# Patient Record
Sex: Female | Born: 1970 | Race: White | Hispanic: No | Marital: Married | Smoking: Never smoker
Health system: Southern US, Community
[De-identification: ages and names within clinical notes are randomized; demographics above are authoritative.]

## PROBLEM LIST (undated history)

## (undated) DIAGNOSIS — M26609 Unspecified temporomandibular joint disorder, unspecified side: Secondary | ICD-10-CM

## (undated) DIAGNOSIS — Z860100 Personal history of colon polyps, unspecified: Secondary | ICD-10-CM

## (undated) DIAGNOSIS — N200 Calculus of kidney: Secondary | ICD-10-CM

## (undated) DIAGNOSIS — Q6589 Other specified congenital deformities of hip: Secondary | ICD-10-CM

## (undated) HISTORY — DX: Other specified congenital deformities of hip: Q65.89

## (undated) HISTORY — DX: Personal history of colon polyps, unspecified: Z86.0100

## (undated) HISTORY — PX: TUBAL LIGATION: SHX77

## (undated) HISTORY — PX: NECK SURGERY: SHX720

## (undated) HISTORY — DX: Unspecified temporomandibular joint disorder, unspecified side: M26.609

## (undated) HISTORY — PX: ABDOMINAL HYSTERECTOMY: SHX81

---
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
## 1997-06-21 ENCOUNTER — Ambulatory Visit (HOSPITAL_COMMUNITY): Admission: RE | Admit: 1997-06-21 | Discharge: 1997-06-21 | Payer: Self-pay | Admitting: *Deleted

## 1997-07-13 ENCOUNTER — Encounter: Admission: RE | Admit: 1997-07-13 | Discharge: 1997-07-13 | Payer: Self-pay | Admitting: Family Medicine

## 1997-07-20 ENCOUNTER — Encounter: Admission: RE | Admit: 1997-07-20 | Discharge: 1997-07-20 | Payer: Self-pay | Admitting: Family Medicine

## 1997-07-25 ENCOUNTER — Ambulatory Visit (HOSPITAL_COMMUNITY): Admission: RE | Admit: 1997-07-25 | Discharge: 1997-07-25 | Payer: Self-pay | Admitting: *Deleted

## 1997-07-27 ENCOUNTER — Encounter: Admission: RE | Admit: 1997-07-27 | Discharge: 1997-07-27 | Payer: Self-pay | Admitting: Family Medicine

## 1997-08-03 ENCOUNTER — Encounter: Admission: RE | Admit: 1997-08-03 | Discharge: 1997-08-03 | Payer: Self-pay | Admitting: Family Medicine

## 1997-08-08 ENCOUNTER — Encounter: Admission: RE | Admit: 1997-08-08 | Discharge: 1997-08-08 | Payer: Self-pay | Admitting: Family Medicine

## 1997-08-10 ENCOUNTER — Inpatient Hospital Stay (HOSPITAL_COMMUNITY): Admission: AD | Admit: 1997-08-10 | Discharge: 1997-08-10 | Payer: Self-pay | Admitting: Obstetrics

## 1997-08-12 ENCOUNTER — Inpatient Hospital Stay (HOSPITAL_COMMUNITY): Admission: AD | Admit: 1997-08-12 | Discharge: 1997-08-15 | Payer: Self-pay | Admitting: Obstetrics

## 1997-09-07 ENCOUNTER — Encounter: Admission: RE | Admit: 1997-09-07 | Discharge: 1997-09-07 | Payer: Self-pay | Admitting: Family Medicine

## 1997-09-20 ENCOUNTER — Encounter: Admission: RE | Admit: 1997-09-20 | Discharge: 1997-09-20 | Payer: Self-pay | Admitting: Sports Medicine

## 2004-01-22 ENCOUNTER — Emergency Department (HOSPITAL_COMMUNITY): Admission: EM | Admit: 2004-01-22 | Discharge: 2004-01-22 | Payer: Self-pay | Admitting: Emergency Medicine

## 2004-03-05 ENCOUNTER — Ambulatory Visit: Payer: Self-pay | Admitting: Internal Medicine

## 2004-04-20 ENCOUNTER — Ambulatory Visit: Payer: Self-pay | Admitting: Internal Medicine

## 2004-11-10 ENCOUNTER — Ambulatory Visit: Payer: Self-pay | Admitting: Family Medicine

## 2004-11-27 ENCOUNTER — Other Ambulatory Visit: Admission: RE | Admit: 2004-11-27 | Discharge: 2004-11-27 | Payer: Self-pay | Admitting: Obstetrics & Gynecology

## 2004-12-07 ENCOUNTER — Encounter: Payer: Self-pay | Admitting: Family Medicine

## 2004-12-07 LAB — CONVERTED CEMR LAB

## 2005-01-30 ENCOUNTER — Ambulatory Visit (HOSPITAL_COMMUNITY): Admission: RE | Admit: 2005-01-30 | Discharge: 2005-01-30 | Payer: Self-pay | Admitting: Obstetrics & Gynecology

## 2005-01-30 ENCOUNTER — Encounter (INDEPENDENT_AMBULATORY_CARE_PROVIDER_SITE_OTHER): Payer: Self-pay | Admitting: Specialist

## 2005-04-08 HISTORY — PX: PARTIAL HYSTERECTOMY: SHX80

## 2005-05-08 ENCOUNTER — Observation Stay (HOSPITAL_COMMUNITY): Admission: RE | Admit: 2005-05-08 | Discharge: 2005-05-09 | Payer: Self-pay | Admitting: Obstetrics & Gynecology

## 2005-05-08 ENCOUNTER — Encounter (INDEPENDENT_AMBULATORY_CARE_PROVIDER_SITE_OTHER): Payer: Self-pay | Admitting: Specialist

## 2005-11-06 ENCOUNTER — Ambulatory Visit: Payer: Self-pay | Admitting: Internal Medicine

## 2005-12-11 ENCOUNTER — Ambulatory Visit: Payer: Self-pay | Admitting: Family Medicine

## 2006-06-14 ENCOUNTER — Ambulatory Visit: Payer: Self-pay | Admitting: Family Medicine

## 2006-06-14 ENCOUNTER — Encounter: Payer: Self-pay | Admitting: Family Medicine

## 2006-06-14 LAB — CONVERTED CEMR LAB
Inflenza A Ag: NEGATIVE
Influenza B Ag: NEGATIVE

## 2007-01-13 ENCOUNTER — Ambulatory Visit: Payer: Self-pay | Admitting: Family Medicine

## 2007-01-13 DIAGNOSIS — G43009 Migraine without aura, not intractable, without status migrainosus: Secondary | ICD-10-CM | POA: Insufficient documentation

## 2007-01-28 ENCOUNTER — Ambulatory Visit: Payer: Self-pay | Admitting: Family Medicine

## 2007-01-28 DIAGNOSIS — M26609 Unspecified temporomandibular joint disorder, unspecified side: Secondary | ICD-10-CM | POA: Insufficient documentation

## 2007-03-16 ENCOUNTER — Ambulatory Visit: Payer: Self-pay | Admitting: Family Medicine

## 2007-04-17 ENCOUNTER — Ambulatory Visit: Payer: Self-pay | Admitting: Family Medicine

## 2007-04-21 ENCOUNTER — Encounter: Payer: Self-pay | Admitting: Family Medicine

## 2007-12-21 ENCOUNTER — Ambulatory Visit: Payer: Self-pay | Admitting: Family Medicine

## 2010-05-10 NOTE — Assessment & Plan Note (Signed)
Summary: DISCUSS HEADACHES/CLE   Vital Signs:  Patient Profile:   40 Years Old Female Weight:      120 pounds Temp:     97.5 degrees F oral Pulse rate:   68 / minute Pulse rhythm:   regular BP sitting:   84 / 60  (left arm) Cuff size:   regular  Vitals Entered By: Liane Comber (December 21, 2007 12:31 PM)                 Chief Complaint:  discuss headaches.  History of Present Illness: wants to be referred to different neurologist  saw Dr Ermalene Searing for last HA visit   went to headache clinic -- keeps goint up on topamax doing biofeedback and trigger injections  has had several visits-- stress is contributing to the migraines  is having daily headache at this point   ha tend to be over whole head-- some worsening on exertion was imp for short time-- now worse again    topamax makes her tingle all over and she has lost almost 40 lb-- not eating  some difficulty concentrating also  does not feel depressed or anxious   migraines got worse after hysterectomy   is doing biofeedback q 3 weeks --- "is failing the test"  flexeril - helps a little- but it knocks her out -- even if she takes 1/2 of a pill still has daily headache either way   no particular stress right now-- nl every day stuf   is wondering about needing CT and MRI   head hurts all the time  sometimes is so bad some days that is actually hurts to wash her hair -- scalp is actually very tender      Current Allergies (reviewed today): MORPHINE SULFATE (MORPHINE SULFATE)  Past Medical History:    migraine- chronic daily headache                neurol-- [facility]   Past Surgical History:    Reviewed history from 01/28/2007 and no changes required:                    BTL       1/07      Hyst. endometriosis / ovarian cyst  (1/2 ovary on left)       40 yo    Knee surgery (torn cartilage)   Family History:    Reviewed history from 01/28/2007 and no changes required:  Father: Color blind, DM II       Mother: HTN       Siblings:        DM:  GM       Ovarian CA:  PGM, died       Pancreatitis:  PGF, died       Cataracts:  PGM (both), Aunt, son as a child       HTN:  Sister       Skin CA:  in Grandparent (melanoma)       uncle with migraines                                                               Social History:    Reviewed history from 01/28/2007 and no changes required:  Never Smoked       Alcohol use-no       Drug use-no       Regular exercise-yes, a lot of walking to deliver mail, 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       Marital Status: Married, husband is a deacon x 9 years       Children: 63, 58, and 59 years old, 2 from previous marriage (previous spouse ETOH)       Occupation: [employer]    Review of Systems  General      Complains of loss of appetite and weight loss.      Denies fatigue, fever, and malaise.  Eyes      Denies blurring and eye pain.  CV      Denies chest pain or discomfort, palpitations, and shortness of breath with exertion.  Resp      Denies cough and wheezing.  GI      Complains of nausea.      Denies change in bowel habits and vomiting.  MS      Denies joint pain, joint redness, and joint swelling.  Derm      Denies itching, lesion(s), and rash.  Neuro      Complains of headaches and tingling.      Denies numbness and tremors.  Psych      Denies anxiety and depression.      does get frustrated by headaches   Endo      Complains of cold intolerance.      Denies excessive thirst, excessive urination, and heat intolerance.   Physical Exam  General:     Well-developed,well-nourished,in no acute distress; alert,appropriate and cooperative throughout examination-slim  Head:     normocephalic, atraumatic, and no abnormalities observed.   Eyes:     vision grossly intact, pupils equal, pupils round, and pupils reactive to light.  no conjunctival pallor, injection or icterus  Ears:     R ear normal and L  ear normal.   Nose:     no nasal discharge.   Mouth:     pharynx pink and moist.   Neck:     supple with full rom and no masses or thyromegally, no JVD or carotid bruit  Chest Wall:     No deformities, masses, or tenderness noted. Lungs:     Normal respiratory effort, chest expands symmetrically. Lungs are clear to auscultation, no crackles or wheezes. Heart:     Normal rate and regular rhythm. S1 and S2 normal without gallop, murmur, click, rub or other extra sounds. Msk:     No deformity or scoliosis noted of thoracic or lumbar spine.   Extremities:     No clubbing, cyanosis, edema, or deformity noted with normal full range of motion of all joints.   Neurologic:     cranial nerves II-XII intact, sensation intact to light touch, gait normal, DTRs symmetrical and normal, and Romberg negative.  no tremor  Skin:     Intact without suspicious lesions or rashes Cervical Nodes:     No lymphadenopathy noted Psych:     frustrated and tearful at times     Impression & Recommendations:  Problem # 1:  COMMON MIGRAINE (ICD-346.10) Assessment: Deteriorated chonic daily headache with likely multiple factors including muscle tension/ hormonal change -- and family hx  limited imp on topamax -- and now worsening  also wt loss and other side eff  pt is interested in returning to headache clinic to discuss other pot pharmacologic ts  Her updated medication list for this problem includes:    Ketoprofen 75 Mg Caps (Ketoprofen) ..... One by mouth as needed headache  Orders: Headache Clinic Referral (Headache)   Complete Medication List: 1)  Topamax 200 Mg Tabs (Topiramate) .... One by mouth daily along with 100mg  tab for a total of 300mg  2)  Topamax 100 Mg Tabs (Topiramate) .... One by mouth daily along with 200 mg tab for a total of 300mg  3)  Ketoprofen 75 Mg Caps (Ketoprofen) .... One by mouth as needed headache 4)  Cyclobenzaprine Hcl 10 Mg Tabs (Cyclobenzaprine hcl) .... 1/2-1 as needed  heacache up to three times a day   Patient Instructions: 1)  we will do referral to headache clinic at check out  2)  keep avoiding caffine -- and stay well hydrated    ]

## 2010-05-10 NOTE — Assessment & Plan Note (Signed)
Summary: MIGRAINES      Allergies Added: MORPHINE SULFATE (MORPHINE SULFATE)  Vital Signs:  Patient Profile:   40 Years Old Female Weight:      152.38 pounds Temp:     98.6 degrees F oral Pulse rate:   96 / minute Pulse rhythm:   regular BP sitting:   92 / 60  (left arm) Cuff size:   regular  Vitals Entered By: Delilah Shan (January 13, 2007 12:23 PM)                 Chief Complaint:  Migraines.  History of Present Illness: headaches for several months B temple, occ nausea/vomiting , photophonia, phonophobia Seen at urgent care, given shot Now happening 3 times a week Uses 600 mg daily ibuprofen occ, because makes her drowsy  sister with migraines   Current Allergies (reviewed today): MORPHINE SULFATE (MORPHINE SULFATE)   Family History:    Reviewed history and no changes required:     Physical Exam  General:     Well-developed,well-nourished,in no acute distress; alert,appropriate and cooperative throughout examination Eyes:     No corneal or conjunctival inflammation noted. EOMI. Perrla. Funduscopic exam benign, without hemorrhages, exudates or papilledema. Vision grossly normal. Ears:     External ear exam shows no significant lesions or deformities.  Otoscopic examination reveals clear canals, tympanic membranes are intact bilaterally without bulging, retraction, inflammation or discharge. Hearing is grossly normal bilaterally. Nose:     External nasal examination shows no deformity or inflammation. Nasal mucosa are pink and moist without lesions or exudates. Mouth:     Oral mucosa and oropharynx without lesions or exudates.  Teeth in good repair. Lungs:     Normal respiratory effort, chest expands symmetrically. Lungs are clear to auscultation, no crackles or wheezes. Heart:     Normal rate and regular rhythm. S1 and S2 normal without gallop, murmur, click, rub or other extra sounds. Neurologic:     No cranial nerve deficits noted. Station and gait  are normal. Plantar reflexes are down-going bilaterally. DTRs are symmetrical throughout. Sensory, motor and coordinative functions appear intact.    Impression & Recommendations:  Problem # 1:  COMMON MIGRAINE (ICD-346.10) Nml neuro exam, no red flags. Headache diary given.  Trigger info given.  Stop all caffeine.  Increase regular meals, exercise.  Ibuprofen for mild to moderate headaches, Maxalt sample given  to try with ibuprofen for severe HA.  F/up in 2 weeks.    Patient Instructions: 1)  Please schedule a follow-up appointment in 2 weeks migraines 2)  bring migraine diary    ] Prior Medications (reviewed today): None Current Allergies (reviewed today): MORPHINE SULFATE (MORPHINE SULFATE)

## 2010-05-10 NOTE — Consult Note (Signed)
Summary: [facility]/Consultation [facility]/Dr. Neale Burly  [facility]/Dr. Neale Burly   Imported By: Mickle Asper 04/24/2007 12:56:49  _____________________________________________________________________  External Attachment:    Type:   Image     Comment:   External Document

## 2010-05-10 NOTE — Assessment & Plan Note (Signed)
Summary: FOLLOW UP  Medications Added TOPAMAX 25 MG  TABS (TOPIRAMATE) 1 tab po at bedtime x 1 week , increase to 1 tab by mouth BID        Vital Signs:  Patient Profile:   40 Years Old Female Weight:      155.25 pounds Temp:     98.1 degrees F oral Pulse rate:   84 / minute Pulse rhythm:   regular BP sitting:   112 / 64  (left arm) Cuff size:   regular  Vitals Entered By: Delilah Shan (January 28, 2007 3:23 PM)                 Chief Complaint:  Follow up.  Headache HPI:      The patient comes in for chronic management of headaches which have been unstable.  Since the last visit, the frequency of headaches have not changed, and the intensity of the headaches have not changed.  The headaches will last anywhere from 2 hours to 3 days at a time.  She has approximately 5+ headaches per month.        The location of the headaches are bilateral.  Headache quality is throbbing or pulsating.  The headaches are associated with photophobia and phonophobia.        Positive alarm features include scalp tenderness.  The patient denies mylagia, fever, malaise, weight loss, focal neurologic symptoms, confusion, seizures, and impaired level of consciousness.        Additional history: no triggers noted has had 2 headaches per week in the last 2 weeks did notice headaches started in 4/08 when she got braces .    Headache Treatment History:      Effective acute treatment medications she has tried include ibuprofen, but other NSAID was ineffective.  Comments: Never tried Maxalt given at last appointment.     Current Allergies (reviewed today): MORPHINE SULFATE (MORPHINE SULFATE)      Physical Exam  General:     Well-developed,well-nourished,in no acute distress; alert,appropriate and cooperative throughout examination Mouth:     jaw ttp B TMJ joint, pops with opening  Headache Neurologic Exam:    Cranial Nerves Intact:  Yes    Focal Neurologic Deficit:  No    Impression &  Recommendations:  Problem # 1:  COMMON MIGRAINE (ICD-346.10) Headache diary reviewed.  Continue diary. May be secondary to braces and TMJ as triggers.  Given info on treating/decreasing TMJ.  Will try migraine prophylaxis in form of topamax, titrate up. If no improvement, refer to [facility].   Complete Medication List: 1)  Topamax 25 Mg Tabs (Topiramate) .Marland Kitchen.. 1 tab po at bedtime x 1 week , increase to 1 tab by mouth bid   Patient Instructions: 1)  Please schedule a follow-up appointment in 1 month, migraine f/up    Prescriptions: TOPAMAX 25 MG  TABS (TOPIRAMATE) 1 tab po at bedtime x 1 week , increase to 1 tab by mouth BID  #60 x 3   Entered and Authorized by:   Kerby Nora MD   Signed by:   Kerby Nora MD on 01/28/2007   Method used:   Electronically sent to ...       [facility]  [address]  [id number]       [address]       [place], [place]  [address]       Ph: [phone] or [phone]       Fax: [phone]   RxID:  [email or web address]  ] Prior Medications (reviewed today): None Current Allergies (reviewed today): MORPHINE SULFATE (MORPHINE SULFATE)

## 2010-05-10 NOTE — Assessment & Plan Note (Signed)
Summary: 1 M F/U  DLO  Medications Added TOPAMAX 50 MG  TABS (TOPIRAMATE) Take 1 tablet by mouth once qHS        Vital Signs:  Patient Profile:   40 Years Old Female Weight:      140.50 pounds Temp:     98.2 degrees F oral Pulse rate:   76 / minute Pulse rhythm:   regular BP sitting:   98 / 56  (left arm) Cuff size:   regular  Vitals Entered By: Delilah Shan (March 16, 2007 9:26 AM)                 Chief Complaint:  Follow up on migraine med.  Headache HPI:      The patient comes in for chronic management of headaches which have been unstable.  Since the last visit, the frequency of headaches have decreased, and the intensity of the headaches have decreased.  The headaches will last anywhere from 2 hours to 3 days at a time.  She has approximately 3 headaches per month.        The location of the headaches are bilateral.  Headache quality is throbbing or pulsating.  The headaches are associated with photophobia.        Additional history: in mornings, not as severe as previously decreased appetiti 15 lb weight loss.     Current Allergies (reviewed today): MORPHINE SULFATE (MORPHINE SULFATE)  Past Surgical History:    Reviewed history from 01/28/2007 and no changes required:                    BTL       1/07      Hyst. endometriosis / ovarian cyst  (1/2 ovary on left)       40 yo    Knee surgery (torn cartilage)      Physical Exam  General:     Well-developed,well-nourished,in no acute distress; alert,appropriate and cooperative throughout examination Head:     Normocephalic and atraumatic without obvious abnormalities. No apparent alopecia or balding. Eyes:     No corneal or conjunctival inflammation noted. EOMI. Perrla. Funduscopic exam benign, without hemorrhages, exudates or papilledema. Vision grossly normal. Ears:     External ear exam shows no significant lesions or deformities.  Otoscopic examination reveals clear canals, tympanic membranes are  intact bilaterally without bulging, retraction, inflammation or discharge. Hearing is grossly normal bilaterally. Nose:     External nasal examination shows no deformity or inflammation. Nasal mucosa are pink and moist without lesions or exudates. Mouth:     jaw ttp B TMJ joint, pops with opening, braces Lungs:     Normal respiratory effort, chest expands symmetrically. Lungs are clear to auscultation, no crackles or wheezes. Heart:     Normal rate and regular rhythm. S1 and S2 normal without gallop, murmur, click, rub or other extra sounds. Neurologic:     No cranial nerve deficits noted. Station and gait are normal. Plantar reflexes are down-going bilaterally. DTRs are symmetrical throughout. Sensory, motor and coordinative functions appear intact. Psych:     Cognition and judgment appear intact. Alert and cooperative with normal attention span and concentration. No apparent delusions, illusions, hallucinations    Impression & Recommendations:  Problem # 1:  COMMON MIGRAINE (ICD-346.10) Assessment: Improved May be improved due to not having brace adjusted. She had orhtodontist appt on 12/18.  We will increase topamax to 50 mg daily.  She has had weight loss due to med,  if this continues we will need to stop medication and try another one vs referral. F/up in 1 month for weight check and to determine if higher dose helping or if worse beacuse of braces adjustment.  Complete Medication List: 1)  Topamax 50 Mg Tabs (Topiramate) .... Take 1 tablet by mouth once qhs   Patient Instructions: 1)  Please schedule a follow-up appointment in 1 month, HA 2)  Follow weight at home. Call if significant weight loss continues.    Prescriptions: TOPAMAX 50 MG  TABS (TOPIRAMATE) Take 1 tablet by mouth once qHS  #30 x 3   Entered and Authorized by:   Kerby Nora MD   Signed by:   Kerby Nora MD on 03/16/2007   Method used:   Electron[facility] to ...       [facility]  [address]  [id number]       [address]       [place], [place]  [address]       Ph: [phone] or [phone]       Fax: [phone]   RxID:   [id number]  ] Current Allergies (reviewed today): MORPHINE SULFATE (MORPHINE SULFATE) Current Medications (including changes made in today's visit):  TOPAMAX 50 MG  TABS (TOPIRAMATE) Take 1 tablet by mouth once qHS

## 2010-05-10 NOTE — Assessment & Plan Note (Signed)
Summary: 1 m f/u  HA    Vital Signs:  Patient Profile:   40 Years Old Female Weight:      138.38 pounds Temp:     97.8 degrees F oral Pulse rate:   68 / minute Pulse rhythm:   regular BP sitting:   122 / 60  (left arm) Cuff size:   regular  Vitals Entered By: Delilah Shan (April 17, 2007 12:11 PM)                 Chief Complaint:  1 month follow up - H/A.  History of Present Illness: Still having headaches every day, but severe 2 times per week no change followiung orthodontist appointment We did not actually increase the topamax because she had already been on  ibuprofen is making it somewhat worse at times  Headache HPI:      The headaches will last anywhere from 2 hours to 3 days at a time.  She has approximately 3 headaches per month.        The location of the headaches are bilateral.  Headache quality is throbbing or pulsating.  The headaches are associated with photophobia.        Current Allergies: MORPHINE SULFATE (MORPHINE SULFATE)      Physical Exam  General:     Well-developed,well-nourished,in no acute distress; alert,appropriate and cooperative throughout examination Head:     left facial tenderness to palp Eyes:     No corneal or conjunctival inflammation noted. EOMI. Perrla. Funduscopic exam benign, without hemorrhages, exudates or papilledema. Vision grossly normal. Neck:     ttp over occipatl notches B as wel as down B trapezius, with trigger points Lungs:     Normal respiratory effort, chest expands symmetrically. Lungs are clear to auscultation, no crackles or wheezes. Heart:     Normal rate and regular rhythm. S1 and S2 normal without gallop, murmur, click, rub or other extra sounds. Neurologic:     No cranial nerve deficits noted. Station and gait are normal. DTRs are symmetrical throughout. Sensory, motor and coordinative functions appear intact.    Impression & Recommendations:  Problem # 1:  COMMON MIGRAINE (ICD-346.10) Will  try to increase to 50 mg two times a day. She will call oif this is too sedating.  If so will try atenolol.  Given poor control for several months and neck pain, she may benefit from trigger point injections, etc so [facility]. Orders: Headache Clinic Referral (Headache)   Complete Medication List: 1)  Topamax 50 Mg Tabs (Topiramate) .... Take 1 tablet by mouth once qhs   Patient Instructions: 1)  Increase topamax to two times a day. Cal if too sedating to try diffent medication. 2)  Referral Appointment Information  [facility] 3)  Day/Date: 4)  Time: 5)  Place/MD: 6)  Address: 7)  Phone/Fax: 8)  Patient given appointment information. Information/Orders faxed/mailed.    ]

## 2010-07-09 ENCOUNTER — Telehealth: Payer: Self-pay | Admitting: *Deleted

## 2010-07-09 NOTE — Telephone Encounter (Signed)
[facility] Triage Call Report Triage Record Num: [id number] Operator: Elita Boone Patient Name: [name] Call Date & Time: 07/07/2010 10:53:59AM Patient Phone: [phone] PCP: Audrie Gallus. Tower Patient Gender: Female PCP Fax : Patient DOB: 1970-07-07 Practice Name: [facility] Reason for Call: Pt is calling to reports that she is cramping and spotting. Onset 03/31. pt reports that she had a hysterectomy 8 yrs ago. Reports pressure when she voids. Pt instructed to be seen in 4 hrs. for urinary tract symptoms. Home care advice given for vaginal discharge. No emergent s/s. Protocol(s) Used: Vaginal Discharge or Irritation Recommended Outcome per Protocol: See Provider within 4 hours Reason for Outcome: Urinary tract symptoms AND any flank or low back pain Has one or more urinary tract symptoms Care Advice: ~ Call provider if you develop flank or low back pain, fever, chills, or generally feel sick. Increase intake of fluids. Try to drink 8 oz. (.2 liter) every hour when awake, including unsweetened cranberry juice, unless on restricted fluids for other medical reasons. Take sips of fluid or eat ice chips if nauseated or vomiting. ~ ~ SYMPTOM / CONDITION MANAGEMENT ~ CAUTIONS Limit carbonated, alcoholic, and caffeinated beverages such as coffee, tea and soda. Avoid nonprescription cold and allergy medications that contain caffeine. Limit intake of tomatoes, fruit juices (except for unsweetened cranberry juice), dairy products, spicy foods, sugar, and artificial sweeteners (aspartame or saccharine). Stop or decrease smoking. Reducing exposure to bladder irritants may help lessen urgency. ~ ~ Call provider if urine is pink, red, smoky or cola colored. 07/07/2010 11:03:46AM Page 1 of 1 [id number]

## 2010-07-27 NOTE — Telephone Encounter (Signed)
Reviewed

## 2010-07-27 NOTE — Telephone Encounter (Signed)
Dr. Ermalene Searing, did you ever see this call a nurse note?   It was from 4/02.

## 2010-08-24 NOTE — Op Note (Signed)
[name], [name]                ACCOUNT NO.:  [id number]   MEDICAL RECORD NO.:  [id number]          PATIENT TYPE:  AMB   LOCATION:  SDC                           FACILITY:  [facility]   PHYSICIAN:  Freddy Finner, M.D.   DATE OF BIRTH:  October 04, 1970   DATE OF PROCEDURE:  01/30/2005  DATE OF DISCHARGE:                                 OPERATIVE REPORT   PREOPERATIVE DIAGNOSES:  1.  Pelvic pain.  2.  Dysfunctional uterine bleeding.  3.  Inhomogeneous enlargement of uterus on pelvic ultrasound.  4.  Suspected pelvic endometriosis and/or adenomyosis.   POSTOPERATIVE DIAGNOSES:  1.  Probable adenomyosis with boggy enlargement of uterus.  2.  No other pelvic pathology was identified.  No apparent abnormalities of      the upper abdomen.   OPERATIVE PROCEDURE:  D&C followed by laparoscopy with uterosacral nerve  ablation with bipolar coagulation of uterosacral ligaments.   SURGEON:  Dr. Jennette Kettle   ANESTHESIA:  General.   ESTIMATED INTRAOPERATIVE BLOOD LOSS:  Less than 10 mL.   INTRAOPERATIVE COMPLICATIONS:  None.   Patient is a 40 year old with three living children and six previous  pregnancies who presented initially with complaints of menorrhagia and large  clots with her menses and lower abdominal and pelvic pain and low back pain.  Subsequent evaluations included pelvic ultrasound which showed inhomogeneous  pattern to the uterus.  She has had irregular bleeding which she believe is  related to attempts to use oral contraceptives.  She is admitted now for  surgery.   She was admitted on the morning of surgery.  She was given a bolus of  Rocephin IV.  She was brought to the operating room, there placed under  adequate general anesthesia, placed in the dorsal lithotomy position using  the [place] stirrups system.  Betadine prep was carried out in the usual  fashion.  Bladder was evacuated with a Robinson catheter.  Bivalve speculum  was introduced into the vagina.  The cervix was  visualized, grasped with a  single tooth tenaculum on the anterior lip.  Uterus sounded to 9.5 cm.  Cervix was easily dilated with Hegar dilators to approximately 25.  Gentle  sharp curettage with a Heaney curette was carried out followed by expiration  with polyp forceps.  This material was submitted for histologic examination.  Hulka tenaculum was attached to the cervix without difficulty.  Sterile  drapes were then applied.  Two small incisions were made in the abdomen, one  at the umbilicus and one just above the symphysis.  An 11 mm nonbladed  disposable trocar was introduced at the umbilicus while elevating the  anterior abdominal wall manually.  Direct inspection revealed adequate  placement with no evidence of injury on entry.  Pneumoperitoneum was allowed  to accumulate using carbon dioxide gas.  Systematic examination of the  pelvic and abdominal contents was carried out.  A 5 mm trocar was placed  under direct visualization through a small incision just above the  symphysis.  Through it a blunt probe was used during the procedure for  traction and  exposure.  After examining pelvic and abdominal contents  photographs were made and are retained in the office record.  There was no  evidence of pelvic adhesions.  There was no evidence of pelvic  endometriosis.  The uterosacrals were well identified.  They were placed on  stretch using the Hulka tenaculum and the cervix and each was cauterized so  that __________  cervix using the bipolar Kleppinger forceps.  There was no  intra-abdominal bleeding.  Procedure at this point was terminated.  All gas  was allowed to escape from the abdomen.  The skin incisions were closed with  interrupted subcuticular sutures of 3-0 Dexon.  0.25% plain Marcaine was  injected through the incision sites for postoperative analgesia.  0.25-inch  Steri-Strips were also applied to the lower incision.  The patient was given  30 mg of Toradol IV and 30 mg IM  for postoperative analgesia.  She was  awakened, taken to recovery in good condition.      Freddy Finner, M.D.  Electronically Signed     WRN/MEDQ  D:  01/30/2005  T:  01/30/2005  Job:  [id number]

## 2010-08-24 NOTE — H&P (Signed)
[name], [name]                ACCOUNT NO.:  [id number]   MEDICAL RECORD NO.:  [id number]          PATIENT TYPE:  AMB   LOCATION:  SDC                           FACILITY:  [facility]   PHYSICIAN:  Freddy Finner, M.D.   DATE OF BIRTH:  Feb 05, 1971   DATE OF ADMISSION:  05/08/2005  DATE OF DISCHARGE:                                HISTORY & PHYSICAL   ADMISSION DIAGNOSIS:  1.  Uterine enlargement.  2.  Menorrhagia.  3.  Laparoscopic and ultrasound findings most consistent with adenomyosis.   HISTORY OF PRESENT ILLNESS:  The patient is a 40 year old white married  female, gravida 6, para 3, who has been followed in the office since August  of 2006 at which time she was initially evaluated by Julio Sicks, N.P. with  a chief complaint of menorrhagia, low abdominal, pelvic and low back pain,  and dyspareunia. A pelvic ultrasound on December 02, 2004 showed an  inhomogeneous myometrium suggesting adenomyosis. Her clinical symptoms were  consistent with adenomyosis and/or pelvic endometriosis and she had  diagnostic laparoscopy performed on January 30, 2005 as well as D&C. The  only pelvic abnormality was an irregular boggy uterus. Endometrial  curetting's were benign and showed degenerative secretory pattern. She also  had uterosacral nerve ablation at the time of that procedure. Clinically her  symptoms have persisted and perhaps even worsened with the patient  complaining in mid December of very heavy flow and flooding accidents with  her menses. She has requested definitive surgical intervention. She is  admitted now for laparoscopically assisted vaginal hysterectomy.   REVIEW OF SYSTEMS:  Her current review of systems is otherwise negative  except as recorded in the present illness.   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   PREVIOUS SURGICAL PROCEDURES:  Tubal ligation in May of 1999. Surgical  treatment of  her left knee at age 72. Laparoscopy as noted above. She has  had vaginal births x3 and has had three spontaneous ABs.   ALLERGIES:  VICODIN.   SOCIAL HISTORY:  She has not had a blood transfusion. She does not use  cigarettes.   FAMILY HISTORY:  Remarkable for diabetes in her father and her paternal  grandmother. Her paternal grandmother also has had ovarian cancer.  Undesignated family with hypertension. A sister with DVTs.   PHYSICAL EXAMINATION:  HEENT:  Grossly within normal limits.  VITAL SIGNS:  Blood pressure in the office was 120/76.  NECK:  Thyroid gland is not palpably enlarged.  CHEST:  Clear to auscultation throughout.  HEART:  Normal sinus rhythm without murmurs, rubs, or gallops.  BREAST EXAM:  Normal. No palpable masses, no nipple discharge or skin  change.  ABDOMEN:  Soft and nontender, there is no organomegaly or palpable masses.  No CVA tenderness.  PELVIC EXAM:  External genitalia, vagina and cervix are normal to  inspection. Bimanual reveals the uterus to be boggy, slightly enlarged,  anterior, and tender to palpation. There are no palpable adnexal masses. The  rectum is palpably  normal and rectovaginal exam confirms the above.  EXTREMITIES:  Without clubbing, cyanosis or edema.   ASSESSMENT:  Uterine adenomyosis with clinical symptoms as described in the  present illness.   PLAN:  Laparoscopically assisted vaginal hysterectomy.   The patient has reviewed a video in the office describing the operative  procedure including the potential for complications. She is prepared to  proceed with surgery.      Freddy Finner, M.D.  Electronically Signed     WRN/MEDQ  D:  05/07/2005  T:  05/07/2005  Job:  [id number]

## 2010-08-24 NOTE — Discharge Summary (Signed)
[name], [name]                ACCOUNT NO.:  [id number]   MEDICAL RECORD NO.:  [id number]          PATIENT TYPE:  OBV   LOCATION:  9303                          FACILITY:  [facility]   PHYSICIAN:  Freddy Finner, M.D.   DATE OF BIRTH:  June 23, 1970   DATE OF ADMISSION:  05/08/2005  DATE OF DISCHARGE:  05/09/2005                                 DISCHARGE SUMMARY   DISCHARGE DIAGNOSES:  1.  Uterine enlargement.  2.  Probable uterine adenomyosis (pathology pending at the time of      discharge).   OPERATIVE PROCEDURE:  Laparoscopically assisted vaginal hysterectomy.   INTRAOPERATIVE AND POSTOPERATIVE COMPLICATIONS:  None.   DISPOSITION:  The patient is in satisfactory improved condition at the time  of her discharge.  She is to have progressively increasing physical  activity, but no vaginal entry and no heavy lifting.  She is to see me in  two weeks for her first postoperative visit.  She is to report heavy vaginal  bleeding or fever, or urinary retention.  She is to  take a regular diet.  She was given Percocet 5 mg/325 mg to be taken one or two every four to six  hours as needed for postoperative pain.  She can mix this with ibuprofen.  She is to take an over-the-counter vitamin supplement of her choice.  She is  to take stool softeners and/or mild laxatives as needed.   Details of the present illness, the past history, the family history, review  of systems and physical exam are recorded in the admission note.  Physical  findings on admission were remarkable for a slight enlargement of the  uterus.  Her clinical picture is that of adenomyosis with a failed  laparoscopy and D&C for management of pain and bleeding.   LABORATORY DATA DURING THIS ADMISSION:  A normal urinalysis on admission, a  normal prothrombin time and PTT, a normal CBC with a hemoglobin of 14.  Postoperative CBC was normal except for a hemoglobin of 10.1, which is  appropriate for estimated blood loss at the time of  surgery.   HOSPITAL COURSE:  The patient was admitted on the morning of surgery.  The  above described operative procedure was accomplished  without difficulty.  Her postoperative course was entirely satisfactory.  By the evening of the  first postoperative day, she was in satisfactory condition.  She was  discharged home with disposition as noted above.      Freddy Finner, M.D.  Electronically Signed     WRN/MEDQ  D:  05/09/2005  T:  05/09/2005  Job:  [id number]

## 2010-08-24 NOTE — Op Note (Signed)
[name], [name]                ACCOUNT NO.:  [id number]   MEDICAL RECORD NO.:  [id number]          PATIENT TYPE:  OBV   LOCATION:  9303                          FACILITY:  [facility]   PHYSICIAN:  Freddy Finner, M.D.   DATE OF BIRTH:  March 17, 1971   DATE OF PROCEDURE:  05/08/2005  DATE OF DISCHARGE:                                 OPERATIVE REPORT   PREOPERATIVE DIAGNOSES:  Uterine enlargement, suspected uterine adenomyosis,  clinical symptoms of menorrhagia and pelvic pain unresolved by previous  laparoscopy and uterosacral nerve ablation and D&C.   POSTOPERATIVE DIAGNOSES:  Uterine enlargement, suspected uterine  adenomyosis, clinical symptoms of menorrhagia and pelvic pain unresolved by  previous laparoscopy and uterosacral nerve ablation and D&C.   OPERATIVE PROCEDURE:  Laparoscopy assisted vaginal hysterectomy.   SURGEON:  Dr. Jennette Kettle   ASSISTANT:  Dr. Corliss Parish INTRAOPERATIVE BLOOD LOSS:  250 mL.   INTRAOPERATIVE COMPLICATIONS:  None.   Details of the present illness recorded in the admission note.  Patient was  admitted on the morning of surgery.  She was given a bolus of Rocephin IV  preoperatively.  She was placed in PAS hose.  She was brought to the  operating room, placed under adequate general anesthesia, placed in the  dorsal lithotomy position using the [place] stirrups system.  Betadine prep  was carried out in the usual fashion prepping the abdomen, perineum, and  vagina.  Bladder was evacuated with a Robinson catheter.  Hulka tenaculum  was attached to the cervix under direct visualization.  Sterile drapes were  applied.  Two small incisions were made in the abdomen through old scars,  one at the umbilicus and one just above the symphysis.  An 11 mm non-bladed  disposable trocar was introduced at the umbilicus while elevating the  anterior abdominal wall manually.  Direct inspection revealed adequate  placement with no evidence of injury on entry.   Pneumoperitoneum was allowed  to accumulate with carbon dioxide gas.  5 mm trocar was placed through the  lower incision under direct visualization.  Through it a blunt probe and  later the [place] irrigation system were used.  Examination of pelvis and  abdomen confirmed findings of previous laparoscopy, specifically the only  visible abnormality is the uterus itself.  Using a blunt probe for traction  during the procedure and using the tripolar gyrus device through the  operating channel of the laparoscope the utero-ovarian ligament, round  ligament, fallopian tube, and upper broad ligament were all progressively  developed, sealed, and divided.  This dissection was carried down to the  level just above the uterine arteries.  Attention was then turned vaginally.  Posterior weighted vaginal retractor was placed.  Deaver retractors were  used laterally and anteriorly for exposure.  The cervix was grasped with a  Christella Hartigan' tenaculum after removing the Hulka tenaculum.  Colpotomy incision  was made sharply with the [place] scissors while trimming the mucosa posterior  to the cervix.  Cervix was circumscribed with a scalpel to release the  mucosa.  The gyrus Heaney style clamp was then  used to seal and divide the  uterosacral pedicles with the __________.  Bladder was further advanced off  the cervix and anterior peritoneum entered.  Gyrus device was used to seal  and divide the cardinal ligament pedicles and vessel pedicles.  The uterus  was then delivered through the vaginal introitus.  One small peritoneal band  was remaining.  This was sealed and divided with the gyrus device.  There  was some bleeding on the right side consistent with the uterine artery and a  figure-of-eight suture was placed and the vessel was clamped with a Heaney  and a suture ligature of 0 Monocryl was used to control the bleeding at this  level.  Uterosacrals were then anchored to angles of vagina with an  interrupted 0  Monocryl suture.  Uterosacrals were plicated and posterior  peritoneum closed with an interrupted 0 Monocryl.  Cuff was closed  vertically with figure-of-eights of 0 Monocryl.  Foley catheter was placed.  Reinspection was carried out laparoscopically and using the bipolar portion  of the gyrus device small bleeding sources in several locations were  controlled.  This was confirmed complete by examination under irrigating  solution and under reduced intra-abdominal pressure.  With complete  hemostasis the irrigating solution was all aspirated from the abdomen.  The  incisions were anesthetized with 0.5% plain Marcaine.  The incisions were  closed with interrupted subcuticular sutures of 3-0 Dexon.  Steri-Strips  were applied to the lower incision.  The patient was awakened, taken to the  recovery room in good condition.      Freddy Finner, M.D.  Electronically Signed     WRN/MEDQ  D:  05/08/2005  T:  05/08/2005  Job:  [id number]

## 2011-02-15 ENCOUNTER — Encounter: Payer: Self-pay | Admitting: Family Medicine

## 2011-02-18 ENCOUNTER — Ambulatory Visit: Payer: Self-pay | Admitting: Family Medicine

## 2011-07-10 ENCOUNTER — Telehealth: Payer: Self-pay

## 2011-07-10 NOTE — Telephone Encounter (Signed)
Pt request last year of medical records to be sent to [facility] [facility] [phone]. I spoke with Bonita Quin at front desk and she said pt should have [facility] request the information needed. I gave Avonna our fax # [phone] to give to [facility] when records requested.

## 2011-07-30 ENCOUNTER — Telehealth: Payer: Self-pay | Admitting: *Deleted

## 2011-07-30 NOTE — Telephone Encounter (Signed)
[name] called to request medical records.  She stated that [name] has not been seen in there office yet and they would like to get records before her appt.  [name] stated that patient told her that she had already signed a release of information form in our office so they could get records.  I checked in her chart and I do not see a release form.  Please advise.  [name]'s fax number is [phone].

## 2011-07-30 NOTE — Telephone Encounter (Signed)
Needs medical release get if not in chart then send records as requested ASAP. It appears she may have requested earlier in April see that note.

## 2011-07-31 NOTE — Telephone Encounter (Signed)
Left message for leslie advising her that patient not seen in our office in since 2009 and asked her to call back if she still needed records

## 2011-08-28 ENCOUNTER — Emergency Department (HOSPITAL_COMMUNITY)
Admission: EM | Admit: 2011-08-28 | Discharge: 2011-08-28 | Disposition: A | Discharge status: Home / Self Care | Payer: Federal, State, Local not specified - PPO | Attending: Emergency Medicine | Admitting: Emergency Medicine

## 2011-08-28 ENCOUNTER — Encounter (HOSPITAL_COMMUNITY): Payer: Self-pay | Admitting: Emergency Medicine

## 2011-08-28 ENCOUNTER — Emergency Department (HOSPITAL_COMMUNITY): Payer: Federal, State, Local not specified - PPO

## 2011-08-28 DIAGNOSIS — M545 Low back pain, unspecified: Secondary | ICD-10-CM | POA: Insufficient documentation

## 2011-08-28 DIAGNOSIS — R110 Nausea: Secondary | ICD-10-CM | POA: Insufficient documentation

## 2011-08-28 DIAGNOSIS — Z79899 Other long term (current) drug therapy: Secondary | ICD-10-CM | POA: Insufficient documentation

## 2011-08-28 DIAGNOSIS — N201 Calculus of ureter: Secondary | ICD-10-CM

## 2011-08-28 DIAGNOSIS — R109 Unspecified abdominal pain: Secondary | ICD-10-CM | POA: Insufficient documentation

## 2011-08-28 LAB — POCT I-STAT, CHEM 8
BUN: 15 mg/dL (ref 6–23)
Calcium, Ion: 1.19 mmol/L (ref 1.12–1.32)
Chloride: 107 mEq/L (ref 96–112)
Creatinine, Ser: 0.8 mg/dL (ref 0.50–1.10)
Glucose, Bld: 94 mg/dL (ref 70–99)
HCT: 38 % (ref 36.0–46.0)
Hemoglobin: 12.9 g/dL (ref 12.0–15.0)
Potassium: 3.5 mEq/L (ref 3.5–5.1)
Sodium: 141 mEq/L (ref 135–145)
TCO2: 24 mmol/L (ref 0–100)

## 2011-08-28 LAB — URINALYSIS, ROUTINE W REFLEX MICROSCOPIC
Bilirubin Urine: NEGATIVE
Glucose, UA: NEGATIVE mg/dL
Ketones, ur: NEGATIVE mg/dL
Leukocytes, UA: NEGATIVE
Nitrite: NEGATIVE
Protein, ur: NEGATIVE mg/dL
Specific Gravity, Urine: 1.012 (ref 1.005–1.030)
Urobilinogen, UA: 1 mg/dL (ref 0.0–1.0)
pH: 7 (ref 5.0–8.0)

## 2011-08-28 LAB — URINE MICROSCOPIC-ADD ON

## 2011-08-28 MED ORDER — HYDROMORPHONE HCL PF 1 MG/ML IJ SOLN
1.0000 mg | Freq: Once | INTRAMUSCULAR | Status: AC
  Administered 2011-08-28: 1 mg via INTRAVENOUS
  Filled 2011-08-28: qty 1

## 2011-08-28 MED ORDER — ONDANSETRON HCL 4 MG/2ML IJ SOLN
4.0000 mg | Freq: Once | INTRAMUSCULAR | Status: AC
  Administered 2011-08-28: 4 mg via INTRAVENOUS
  Filled 2011-08-28: qty 2

## 2011-08-28 MED ORDER — OXYCODONE-ACETAMINOPHEN 5-325 MG PO TABS: 2.0000 | ORAL_TABLET | ORAL | Status: AC | PRN

## 2011-08-28 MED ORDER — ONDANSETRON HCL 4 MG/2ML IJ SOLN
4.0000 mg | Freq: Once | INTRAMUSCULAR | Status: AC
  Administered 2011-08-28: 4 mg via INTRAVENOUS

## 2011-08-28 MED ORDER — KETOROLAC TROMETHAMINE 30 MG/ML IJ SOLN
30.0000 mg | Freq: Once | INTRAMUSCULAR | Status: AC
  Administered 2011-08-28: 30 mg via INTRAVENOUS
  Filled 2011-08-28: qty 1

## 2011-08-28 MED ORDER — ONDANSETRON HCL 4 MG/2ML IJ SOLN
INTRAMUSCULAR | Status: AC
  Filled 2011-08-28: qty 2

## 2011-08-28 MED ORDER — FENTANYL CITRATE 0.05 MG/ML IJ SOLN
50.0000 ug | Freq: Once | INTRAMUSCULAR | Status: AC
  Administered 2011-08-28: 50 ug via INTRAVENOUS
  Filled 2011-08-28: qty 2

## 2011-08-28 MED ORDER — FENTANYL CITRATE 0.05 MG/ML IJ SOLN
50.0000 ug | Freq: Once | INTRAMUSCULAR | Status: AC
  Administered 2011-08-28: 50 ug via INTRAVENOUS

## 2011-08-28 MED ORDER — ONDANSETRON HCL 4 MG PO TABS: 4.0000 mg | ORAL_TABLET | Freq: Four times a day (QID) | ORAL | Status: AC

## 2011-08-28 NOTE — ED Notes (Signed)
Pt discharged home, only complaint of nausea medicated with zofran. Reporting wanting to go home. Pain reduced to 3/10. Taken out by wheelchair, assisted to car by EMT. Pt a x 4 at discharge.

## 2011-08-28 NOTE — ED Notes (Signed)
Pt A x 4. Reporting pain 4/10 after second dose of Dilaudid. Vitals stable.

## 2011-08-28 NOTE — ED Notes (Signed)
PT. WOKE UP THIS MORNING WITH LEFT LOWER BACK AND LOW ABDOMINAL PAIN , SLIGHT NAUSEA , DENIES VOMITTING OR DIARRHEA. NO DYSURIA OR HEMATURIA .

## 2011-08-28 NOTE — ED Notes (Signed)
Pt states pain remains the same.  Dr Dierdre Highman notified.

## 2011-08-28 NOTE — Discharge Instructions (Signed)

## 2011-08-28 NOTE — ED Provider Notes (Addendum)
History     CSN: [id number]  Arrival date & time 08/28/11  0415   First MD Initiated Contact with Patient 08/28/11 0444      Chief Complaint  Patient presents with  . Back Pain    (Consider location/radiation/quality/duration/timing/severity/associated sxs/prior treatment) HPI History provided by patient and her husband bedside. Woke up at 3 AM with severe left flank pain. Sharp in quality. Radiates from lower back to left lower abdomen. Unable to get comfortable. No alleviating factors. Some nausea but no vomiting. No hematuria. No dysuria, urgency or frequency. No trauma. No history of similar symptoms and no history of kidney stones. Duration constant since onset. Past Medical History  Diagnosis Date  . Migraine     chronic, daily  . Temporomandibular joint disorders, unspecified     Past Surgical History  Procedure Date  . Tubal ligation   . Partial hysterectomy 1/07    Endometriosis; ovarian cyst (1/2 ovary on left)  . Knee cartilage surgery 1988    torn cartilage  . Abdominal hysterectomy     Family History  Problem Relation Age of Onset  . Diabetes Father   . Color blindness Father   . Hypertension Mother   . Diabetes      GM  . Ovarian cancer Paternal Grandmother   . Pancreatitis Paternal Grandfather   . Cataracts Paternal Grandmother   . Cataracts      Aunt  . Cataracts Son     childhood  . Hypertension Sister   . Melanoma      grandparent  . Migraines      uncle    History  Substance Use Topics  . Smoking status: Never Smoker   . Smokeless tobacco: Not on file  . Alcohol Use: No    OB History    Grav Para Term Preterm Abortions TAB SAB Ect Mult Living                  Review of Systems  Constitutional: Negative for fever and chills.  HENT: Negative for neck pain and neck stiffness.   Eyes: Negative for pain.  Respiratory: Negative for shortness of breath.   Cardiovascular: Negative for chest pain.  Gastrointestinal: Negative for  abdominal pain.  Genitourinary: Positive for flank pain. Negative for dysuria.  Musculoskeletal: Negative for back pain.  Skin: Negative for rash.  Neurological: Negative for headaches.  All other systems reviewed and are negative.    Allergies  Morphine sulfate  Home Medications   Current Outpatient Rx  Name Route Sig Dispense Refill  . CYCLOBENZAPRINE HCL 10 MG PO TABS Oral Take 5-10 mg by mouth 3 (three) times daily as needed. For pain    . GABAPENTIN 600 MG PO TABS Oral Take 600 mg by mouth at bedtime.    Marland Kitchen KETOPROFEN 75 MG PO CAPS Oral Take 75 mg by mouth 3 (three) times daily as needed. For pain    . TOPIRAMATE 50 MG PO TABS Oral Take 50 mg by mouth daily. Takes with 200 mg tablet to equal 250mg  daily    . TOPIRAMATE 200 MG PO TABS Oral Take 200 mg by mouth daily. Takes with 50mg  tablet to equal 250 mg daily.      BP 125/89  Pulse 91  Temp(Src) 97.6 F (36.4 C) (Oral)  Resp 19  SpO2 98%  Physical Exam  Constitutional: She is oriented to person, place, and time. She appears well-developed and well-nourished.  HENT:  Head: Normocephalic and atraumatic.  Eyes:  Conjunctivae and EOM are normal. Pupils are equal, round, and reactive to light.  Neck: Trachea normal. Neck supple. No thyromegaly present.  Cardiovascular: Normal rate, regular rhythm, S1 normal, S2 normal and normal pulses.     No systolic murmur is present   No diastolic murmur is present  Pulses:      Radial pulses are 2+ on the right side, and 2+ on the left side.  Pulmonary/Chest: Effort normal and breath sounds normal. She has no wheezes. She has no rhonchi. She has no rales. She exhibits no tenderness.  Abdominal: Soft. Normal appearance and bowel sounds are normal. There is no CVA tenderness and negative Murphy's sign.       Tender over left flank with voluntary guarding. No rebound or tenderness otherwise  Musculoskeletal:       BLE:s Calves nontender, no cords or erythema, negative Homans sign    Neurological: She is alert and oriented to person, place, and time. She has normal strength. No cranial nerve deficit or sensory deficit. GCS eye subscore is 4. GCS verbal subscore is 5. GCS motor subscore is 6.  Skin: Skin is warm and dry. No rash noted. She is not diaphoretic.  Psychiatric: Her speech is normal.       Cooperative and appropriate    ED Course  Procedures (including critical care time)  IV fentanyl, Zofran and Toradol. UA, labs and CAT scan obtained.  Results for orders placed during the hospital encounter of 08/28/11  URINALYSIS, ROUTINE W REFLEX MICROSCOPIC      Component Value Range   Color, Urine YELLOW  YELLOW    APPearance CLEAR  CLEAR    Specific Gravity, Urine 1.012  1.005 - 1.030    pH 7.0  5.0 - 8.0    Glucose, UA NEGATIVE  NEGATIVE (mg/dL)   Hgb urine dipstick LARGE (*) NEGATIVE    Bilirubin Urine NEGATIVE  NEGATIVE    Ketones, ur NEGATIVE  NEGATIVE (mg/dL)   Protein, ur NEGATIVE  NEGATIVE (mg/dL)   Urobilinogen, UA 1.0  0.0 - 1.0 (mg/dL)   Nitrite NEGATIVE  NEGATIVE    Leukocytes, UA NEGATIVE  NEGATIVE   POCT I-STAT, CHEM 8      Component Value Range   Sodium 141  135 - 145 (mEq/L)   Potassium 3.5  3.5 - 5.1 (mEq/L)   Chloride 107  96 - 112 (mEq/L)   BUN 15  6 - 23 (mg/dL)   Creatinine, Ser 1.19  0.50 - 1.10 (mg/dL)   Glucose, Bld 94  70 - 99 (mg/dL)   Calcium, Ion 1.47  8.29 - 1.32 (mmol/L)   TCO2 24  0 - 100 (mmol/L)   Hemoglobin 12.9  12.0 - 15.0 (g/dL)   HCT 56.2  13.0 - 86.5 (%)  URINE MICROSCOPIC-ADD ON      Component Value Range   Squamous Epithelial / LPF RARE  RARE    RBC / HPF 21-50  <3 (RBC/hpf)   Bacteria, UA RARE  RARE    Ct Abdomen Pelvis Wo Contrast  08/28/2011  *RADIOLOGY REPORT*  Clinical Data: Left flank pain  CT ABDOMEN AND PELVIS WITHOUT CONTRAST  Technique:  Multidetector CT imaging of the abdomen and pelvis was performed following the standard protocol without intravenous contrast.  Comparison: None.  Findings: Limited  images through the lung bases demonstrate no significant appreciable abnormality. The heart size is within normal limits. No pleural or pericardial effusion.  Intra-abdominal organ evaluation is limited in the absence of intravenous contrast.  Within this limitation, unremarkable liver, spleen, biliary system, pancreas, adrenal glands.  Symmetric renal size.  No hydronephrosis or hydroureter.  Unable to follow the ureteral course in their entirety given the nondistension and relative lack of intra and retroperitoneal fat. No renal calculi.  No bowel obstruction.  No CT evidence for colitis.  Normal appendix.  Decompressed bladder with circumferential wall thickening.  Mild stranding of the fat within the pre vesicular space. Absent uterus. 2.1 cm right adnexal cyst is nonspecific.  No free intraperitoneal air.  Trace free fluid.  Normal caliber vasculature.  No acute osseous finding.  IMPRESSION: No hydronephrosis or renal calculi. No hydroureter.  Unable to follow the ureters in their entirety given the nondistension.  Decompressed bladder with circumferential wall thickening and mild stranding of the pre vesicular fat is nonspecific.  Correlate with urinalysis if concerned for cystitis.  2.1 cm right adnexal cyst is likely ovarian in origin.  If there is clinical concern for pelvic pathology, ultrasound recommended.  Original Report Authenticated By: Waneta Martins, M.D.   CT Results discussed with radiologist and radiographically less likely kidney stone given no hydro, although clinically presenting like a kidney stone.    MDM   Left flank pain with ureteral colic suspect ureterolithiasis. Improved with pain medications. Plan discharge home with outpatient followup urology. Prescription since for Percocet, Zofran provided. Plan strain all urine. Reliable historian verbalizes understanding kidney stone precautions.        Sunnie Nielsen, MD 08/28/11 1610  Sunnie Nielsen, MD 08/28/11 [phone]

## 2011-08-28 NOTE — ED Notes (Signed)
Pt to ED c/o awaking from sleep with intermittent L flank pain and nausea.  Pt denies blood in urine or urinary s/s. Pt has hysterectomy.

## 2012-05-15 ENCOUNTER — Telehealth: Payer: Self-pay | Admitting: Family Medicine

## 2012-05-15 NOTE — Telephone Encounter (Signed)
Noted  

## 2012-05-15 NOTE — Telephone Encounter (Signed)
[facility] Triage Call Report Triage Record Num: [id number] Operator: Griselda Miner Patient Name: [name] Call Date & Time: 05/15/2012 8:01:40AM Patient Phone: [phone] PCP: Idamae Schuller A. Tower Patient Gender: Female PCP Fax : Patient DOB: Jun 23, 1970 Practice Name: [facility] Reason for Call: Caller: Tracyann/Patient; PCP: Roxy Manns A.; CB#: [phone]; Call regarding Urinary Pain/Bleeding; Stomach is sore like muscle soreness. Is complaining of stomach soreness and tight. Went to bathroom and voided blood-denies clots-maybe a kool-aide color. Last time she had that was a kidney stone. Denies fever, states has a headache. Onset 05/15/12; Has had hysterectomy 10 yrs ago.Also states entire body feels sore. Triaged using Bloody Urine with a disposition to be seen within 4 hours due to generalized muscle pain. Care advice given. First available appointment not until 15:00. Patient will go to urgent care. Protocol(s) Used: Bloody Urine Recommended Outcome per Protocol: See Provider within 4 hours Reason for Outcome: Generalized muscle pain Care Advice: ~ Allow the patient to be in a position of comfort. ~ Another adult should drive. ~ Call EMS [phone] immediately for loss of consciousness. ~ Call provider immediately if taking a statin drug and having both bloody urine and muscle pain in multiple muscles. ~ SYMPTOM / CONDITION MANAGEMENT ~ List, or take, all current prescription(s), nonprescription or alternative medication(s) to provider for evaluation. Total water intake includes drinking water, water in beverages, and water contained in food. Fluids make up about 80% of the body's total hydration need. Individual fluid requirement to maintain hydration vary based on physical activity, environmental factors and illness. Limit fluids that contain sugar, caffeine, or alcohol. Urine will be very light yellow color when you drink enough fluids. ~ 05/15/2012 8:22:31AM Page 1 of 1  [id number]

## 2013-01-12 ENCOUNTER — Emergency Department (HOSPITAL_COMMUNITY): Payer: Federal, State, Local not specified - PPO

## 2013-01-12 ENCOUNTER — Emergency Department (HOSPITAL_COMMUNITY)
Admission: EM | Admit: 2013-01-12 | Discharge: 2013-01-12 | Disposition: A | Discharge status: Home / Self Care | Payer: Federal, State, Local not specified - PPO | Attending: Emergency Medicine | Admitting: Emergency Medicine

## 2013-01-12 ENCOUNTER — Encounter (HOSPITAL_COMMUNITY): Payer: Self-pay | Admitting: Nurse Practitioner

## 2013-01-12 DIAGNOSIS — Z79899 Other long term (current) drug therapy: Secondary | ICD-10-CM | POA: Insufficient documentation

## 2013-01-12 DIAGNOSIS — Z8739 Personal history of other diseases of the musculoskeletal system and connective tissue: Secondary | ICD-10-CM | POA: Insufficient documentation

## 2013-01-12 DIAGNOSIS — G43909 Migraine, unspecified, not intractable, without status migrainosus: Secondary | ICD-10-CM | POA: Insufficient documentation

## 2013-01-12 DIAGNOSIS — M5020 Other cervical disc displacement, unspecified cervical region: Secondary | ICD-10-CM

## 2013-01-12 DIAGNOSIS — R209 Unspecified disturbances of skin sensation: Secondary | ICD-10-CM | POA: Insufficient documentation

## 2013-01-12 DIAGNOSIS — M6281 Muscle weakness (generalized): Secondary | ICD-10-CM | POA: Insufficient documentation

## 2013-01-12 DIAGNOSIS — M5412 Radiculopathy, cervical region: Secondary | ICD-10-CM

## 2013-01-12 LAB — CBC WITH DIFFERENTIAL/PLATELET
Basophils Absolute: 0 10*3/uL (ref 0.0–0.1)
Basophils Relative: 0 % (ref 0–1)
Eosinophils Absolute: 0 10*3/uL (ref 0.0–0.7)
Eosinophils Relative: 0 % (ref 0–5)
HCT: 34.2 % — ABNORMAL LOW (ref 36.0–46.0)
Hemoglobin: 12.1 g/dL (ref 12.0–15.0)
Lymphocytes Relative: 30 % (ref 12–46)
Lymphs Abs: 1.4 10*3/uL (ref 0.7–4.0)
MCH: 31.7 pg (ref 26.0–34.0)
MCHC: 35.4 g/dL (ref 30.0–36.0)
MCV: 89.5 fL (ref 78.0–100.0)
Monocytes Absolute: 0.3 10*3/uL (ref 0.1–1.0)
Monocytes Relative: 6 % (ref 3–12)
Neutro Abs: 2.9 10*3/uL (ref 1.7–7.7)
Neutrophils Relative %: 63 % (ref 43–77)
Platelets: 163 10*3/uL (ref 150–400)
RBC: 3.82 MIL/uL — ABNORMAL LOW (ref 3.87–5.11)
RDW: 12.4 % (ref 11.5–15.5)
WBC: 4.6 10*3/uL (ref 4.0–10.5)

## 2013-01-12 LAB — COMPREHENSIVE METABOLIC PANEL
ALT: 16 U/L (ref 0–35)
AST: 26 U/L (ref 0–37)
Albumin: 3.9 g/dL (ref 3.5–5.2)
Alkaline Phosphatase: 34 U/L — ABNORMAL LOW (ref 39–117)
BUN: 18 mg/dL (ref 6–23)
CO2: 24 mEq/L (ref 19–32)
Calcium: 9 mg/dL (ref 8.4–10.5)
Chloride: 105 mEq/L (ref 96–112)
Creatinine, Ser: 0.99 mg/dL (ref 0.50–1.10)
GFR calc Af Amer: 81 mL/min — ABNORMAL LOW (ref >90)
GFR calc non Af Amer: 70 mL/min — ABNORMAL LOW (ref >90)
Glucose, Bld: 78 mg/dL (ref 70–99)
Potassium: 3.5 mEq/L (ref 3.5–5.1)
Sodium: 138 mEq/L (ref 135–145)
Total Bilirubin: 0.6 mg/dL (ref 0.3–1.2)
Total Protein: 6.9 g/dL (ref 6.0–8.3)

## 2013-01-12 LAB — POCT I-STAT TROPONIN I: Troponin i, poc: 0 ng/mL (ref 0.00–0.08)

## 2013-01-12 LAB — GLUCOSE, CAPILLARY: Glucose-Capillary: 77 mg/dL (ref 70–99)

## 2013-01-12 MED ORDER — DEXAMETHASONE SODIUM PHOSPHATE 10 MG/ML IJ SOLN
10.0000 mg | Freq: Once | INTRAMUSCULAR | Status: AC
  Administered 2013-01-12: 10 mg via INTRAMUSCULAR
  Filled 2013-01-12: qty 1

## 2013-01-12 MED ORDER — PREDNISONE 10 MG PO TABS: ORAL_TABLET | ORAL | Status: DC

## 2013-01-12 MED ORDER — DIPHENHYDRAMINE HCL 50 MG/ML IJ SOLN
25.0000 mg | Freq: Once | INTRAMUSCULAR | Status: AC
  Administered 2013-01-12: 25 mg via INTRAMUSCULAR
  Filled 2013-01-12: qty 1

## 2013-01-12 MED ORDER — KETOROLAC TROMETHAMINE 60 MG/2ML IM SOLN
60.0000 mg | Freq: Once | INTRAMUSCULAR | Status: AC
  Administered 2013-01-12: 60 mg via INTRAMUSCULAR
  Filled 2013-01-12: qty 2

## 2013-01-12 MED ORDER — METOCLOPRAMIDE HCL 5 MG/ML IJ SOLN
10.0000 mg | Freq: Once | INTRAMUSCULAR | Status: AC
  Administered 2013-01-12: 10 mg via INTRAMUSCULAR
  Filled 2013-01-12: qty 2

## 2013-01-12 NOTE — ED Notes (Signed)
Pt returned from MRI °

## 2013-01-12 NOTE — ED Provider Notes (Signed)
CSN: [id number]     Arrival date & time 01/12/13  4098 History   First MD Initiated Contact with Patient 01/12/13 1107     Chief Complaint  Patient presents with  . Arm Pain   (Consider location/radiation/quality/duration/timing/severity/associated sxs/prior Treatment) HPI [name] is a 42 y.o. female who presents to emergency department today complaining of left forearm, hand numbness and weakness. Patient states she does have complicated migraines but has never had similar symptoms in the past. She states her symptoms began on Sunday early morning when she woke up in the middle of the night. States that she noticed her left arm did not fill normal, states felt heavy, had some pain in, and numbness from her hand all the way up to axilla. Patient states that yesterday she went to her migraine Dr. who told her to keep and I am and if it's not improving to go to emergency department. Patient states that her numbness is not improving but today she noticed that she had weakness in the hand as well. States she could not grip her paper today. Patient denies any history of strokes, denies any pain in her neck, no recent head or neck injuries. She denies any headache. She has no history of elevated blood pressure, no medical problems. Patient reports no other neuro focal deficits.  Past Medical History  Diagnosis Date  . Migraine     chronic, daily  . Temporomandibular joint disorders, unspecified    Past Surgical History  Procedure Laterality Date  . Tubal ligation    . Partial hysterectomy  1/07    Endometriosis; ovarian cyst (1/2 ovary on left)  . Knee cartilage surgery  1988    torn cartilage  . Abdominal hysterectomy     Family History  Problem Relation Age of Onset  . Diabetes Father   . Color blindness Father   . Hypertension Mother   . Diabetes      GM  . Ovarian cancer Paternal Grandmother   . Pancreatitis Paternal Grandfather   . Cataracts Paternal Grandmother   .  Cataracts      Aunt  . Cataracts Son     childhood  . Hypertension Sister   . Melanoma      grandparent  . Migraines      uncle   History  Substance Use Topics  . Smoking status: Never Smoker   . Smokeless tobacco: Not on file  . Alcohol Use: No   OB History   Grav Para Term Preterm Abortions TAB SAB Ect Mult Living                 Review of Systems  Constitutional: Negative for fever and chills.  HENT: Negative for neck pain and neck stiffness.   Respiratory: Negative for cough, chest tightness and shortness of breath.   Cardiovascular: Negative for chest pain, palpitations and leg swelling.  Gastrointestinal: Negative for nausea, vomiting, abdominal pain and diarrhea.  Musculoskeletal: Negative for myalgias and arthralgias.  Skin: Negative for rash.  Neurological: Positive for weakness and numbness. Negative for dizziness, facial asymmetry, speech difficulty and headaches.  All other systems reviewed and are negative.    Allergies  Morphine sulfate  Home Medications   Current Outpatient Rx  Name  Route  Sig  Dispense  Refill  . ketoprofen (ORUDIS) 75 MG capsule   Oral   Take 75 mg by mouth 3 (three) times daily as needed. For pain         .  levETIRAcetam (KEPPRA) 250 MG tablet   Oral   Take 250-500 mg by mouth every 12 (twelve) hours. Takes 250 mg in am and 500 mg in pm. For migraines.         . Prenatal Vit-Fe Fumarate-FA (PRENATAL MULTIVITAMIN) TABS tablet   Oral   Take 1 tablet by mouth daily at 12 noon.         . topiramate (TOPAMAX) 200 MG tablet   Oral   Take 200 mg by mouth at bedtime.          . cyclobenzaprine (FLEXERIL) 10 MG tablet   Oral   Take 5-10 mg by mouth 3 (three) times daily as needed. For pain          BP 116/73  Pulse 79  Temp(Src) 98.8 F (37.1 C) (Oral)  Resp 16  Ht 5\' 6"  (1.676 m)  Wt 130 lb (58.968 kg)  BMI 20.99 kg/m2  SpO2 97% Physical Exam  Nursing note and vitals reviewed. Constitutional: She appears  well-developed and well-nourished. No distress.  HENT:  Head: Normocephalic.  Eyes: Conjunctivae are normal.  Neck: Neck supple.  Cardiovascular: Normal rate, regular rhythm and normal heart sounds.   Pulmonary/Chest: Effort normal and breath sounds normal. No respiratory distress. She has no wheezes. She has no rales.  Abdominal: Soft. Bowel sounds are normal. She exhibits no distension. There is no tenderness. There is no rebound.  Musculoskeletal: She exhibits no edema.  Neurological: She is alert. Coordination normal.  Cranial nerves intact. No pronator drift. Equal strength of the bicep, tricep muscles bilaterally. Left grip is significantly weaker than the right. Decreased sensation over anterior upper arm, ulnar aspect of the forearm, palmar and dorsal left thumb and index finger. 5 out of 5 and equal lower extremity strength. Normal coordination. Normal gait. Visual fields intact.  Skin: Skin is warm and dry.  Psychiatric: She has a normal mood and affect. Her behavior is normal.    ED Course  Procedures (including critical care time) Labs Review Labs Reviewed  CBC WITH DIFFERENTIAL - Abnormal; Notable for the following:    RBC 3.82 (*)    HCT 34.2 (*)    All other components within normal limits  COMPREHENSIVE METABOLIC PANEL - Abnormal; Notable for the following:    Alkaline Phosphatase 34 (*)    GFR calc non Af Amer 70 (*)    GFR calc Af Amer 81 (*)    All other components within normal limits  GLUCOSE, CAPILLARY  POCT I-STAT TROPONIN I   Imaging Review Mr Brain Wo Contrast  01/12/2013   CLINICAL DATA:  42 year old female with left arm numbness. Left upper extremity pain.  EXAM: MRI HEAD WITHOUT CONTRAST  TECHNIQUE: Multiplanar, multisequence MR imaging was performed. No intravenous contrast was administered.  COMPARISON:  Cervical spine MRI from the same day reported separately. [facility] Head CT without contrast 01/21/2008.  FINDINGS: Cerebral volume is  normal. No restricted diffusion to suggest acute infarction. No midline shift, mass effect, evidence of mass lesion, ventriculomegaly, extra-axial collection or acute intracranial hemorrhage. Cervicomedullary junction and pituitary are within normal limits. Major intracranial vascular flow voids are preserved.  Small anterior frontal lobe white matter foci of T2 and FLAIR hyperintensity, mildly advanced for age. Elsewhere normal gray and white matter signal. No cortical encephalomalacia. Chronic small dural calcification along the left convexity appears unchanged.  Visualized orbit soft tissues are within normal limits. Visualized paranasal sinuses and mastoids are clear. Normal visualized internal auditory structures. Visualized  scalp soft tissues are within normal limits. Normal bone marrow signal.  IMPRESSION: 1. No acute intracranial abnormality.  2. Mild for age nonspecific frontal lobe white matter signal changes. Differential considerations include accelerated small vessel ischemia, sequelae of trauma, hypercoagulable state, vasculitis, migraines, prior infection or demyelination.  3. Cervical spine MRI reported separately.   Electronically Signed   By: Augusto Gamble M.D.   On: 01/12/2013 15:52   Mr Cervical Spine Wo Contrast  01/12/2013   CLINICAL DATA:  Left arm numbness. Soreness and tenderness.  EXAM: MRI CERVICAL SPINE WITHOUT CONTRAST  TECHNIQUE: Multiplanar, multisequence MR imaging was performed. No intravenous contrast was administered.  COMPARISON:  None.  FINDINGS: Cervical spinal alignment is anatomic. There are phleboliths present in both vertebral arteries. Cervical cord appears within normal limits. Posterior fossa and brain imaging is deferred to MRI brain today. Paraspinal soft tissues including prevertebral soft tissues are within normal limits. Mild respiratory motion artifact is present on axial image set. Mild disc desiccation is present throughout the cervical spine. Mild loss of disc  height at C4-C5 and C5-C6.  C2-C3: Negative.  C3-C4: Minimal uncovertebral spurring. Shallow central disk protrusion without stenosis. Central canal and foramina are patent.  C4-C5: Broad-based disc bulging is present without significant central or foraminal stenosis. Mild right foraminal disc bulging and uncovertebral spurring without significant stenosis.  C5-C6: Broad-based central disc osteophyte complex with very mild central stenosis. No cord deformity. Bulging disk extends into the left neural foramen producing moderate left foraminal stenosis potentially affecting the left C6 nerve.  C6-C7: Negative.  C7-T1: Negative.  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 This produces moderate left foraminal stenosis that potentially affects the left C6 nerve.   Electronically Signed   By: Andreas Newport M.D.   On: 01/12/2013 15:54    MDM   1. Herniated disc, cervical   2. Cervical radiculopathy at C6   3. Migraine headache      Spoke with Dr. Danielle Dess regarding pt's symptoms and MRI findings. He has come down and seen pt. Advised steroids, decadron 10mg  given IM in ED. Given prednisone dose pack on D/C  Pt also had migraine after coming back from MRI, given toradol, reglan, benadryl IM which improved her sypmtoms.   Pt will be dc home with careful precautions to return if worsening.   Filed Vitals:   01/12/13 1604 01/12/13 1615 01/12/13 1719 01/12/13 1730  BP: 103/76 105/70 100/60 98/66  Pulse: 62 63 66 61  Temp:      TempSrc:      Resp: 18     Height:      Weight:      SpO2: 100% 100% 100% 100%       Lottie Mussel, PA-C 01/12/13 1933

## 2013-01-12 NOTE — ED Notes (Signed)
PA at bedside.

## 2013-01-12 NOTE — ED Notes (Signed)
Pt made aware that we are awaiting neurosurgery consult. Pt resting in bed. Family at bedside.

## 2013-01-12 NOTE — ED Notes (Signed)
C/o L arm soreness and "tingling in my thumb and fingers", "tingling" in face, elevated BP and headaches for past 4 days. Pt did see PCP for same but was given no diagnosis. Pt reports today she was unable to carry her mail in her L hand and that made her more concerned. A&Ox4, resp e/u

## 2013-01-12 NOTE — ED Notes (Signed)
Contacted MR about wait time. Pt made aware about delay. Pt resting comfortably in bed. VSS.

## 2013-01-12 NOTE — ED Provider Notes (Signed)
Medical screening examination/treatment/procedure(s) were performed by non-physician practitioner and as supervising physician I was immediately available for consultation/collaboration. Devoria Albe, MD, Armando Gang   Ward Givens, MD 01/12/13 [phone]

## 2013-01-12 NOTE — ED Notes (Signed)
Neuro surgery at bedside.

## 2013-01-12 NOTE — Consult Note (Signed)
Reason for Consult: Left upper extremity weakness numbness and tingling Referring Physician: Jodelle Gross Miles  [name] is an 42 y.o. female.  HPI: Patient is a 42 year old right-handed white female who works as a Designer, industrial/product person. She rather suddenly developed neck pain tingling and numbness a few days ago and this is gotten worse such that she lost a considerable amount of strength in her left upper extremity. She is concerned that she may have had a stroke and presented to the emergency room. An MRI of the brain was performed. This was normal. She is seen by a headache specialist for treatment of migraines and her headache specialist recommended also that she come to the emergency room because of the weakness. An MRI of the cervical spine was performed. This demonstrates that there is a disc herniation on the left side at C5-C6 causing some foraminal stenosis for the C6 nerve root.  Past Medical History  Diagnosis Date  . Migraine     chronic, Miles  . Temporomandibular joint disorders, unspecified     Past Surgical History  Procedure Laterality Date  . Tubal ligation    . Partial hysterectomy  1/07    Endometriosis; ovarian cyst (1/2 ovary on left)  . Knee cartilage surgery  1988    torn cartilage  . Abdominal hysterectomy      Family History  Problem Relation Age of Onset  . Diabetes Father   . Color blindness Father   . Hypertension Mother   . Diabetes      GM  . Ovarian cancer Paternal Grandmother   . Pancreatitis Paternal Grandfather   . Cataracts Paternal Grandmother   . Cataracts      Aunt  . Cataracts Son     childhood  . Hypertension Sister   . Melanoma      grandparent  . Migraines      uncle    Social History:  reports that she has never smoked. She does not have any smokeless tobacco history on file. She reports that she does not drink alcohol or use illicit drugs.  Allergies:  Allergies  Allergen Reactions  . Morphine Sulfate Itching    REACTION:  unspecified    Medications: Have not reviewed the patient's medication  Results for orders placed during the hospital encounter of 01/12/13 (from the past 48 hour(s))  GLUCOSE, CAPILLARY     Status: None   Collection Time    01/12/13 10:21 AM      Result Value Range   Glucose-Capillary 77  70 - 99 mg/dL  CBC WITH DIFFERENTIAL     Status: Abnormal   Collection Time    01/12/13 11:35 AM      Result Value Range   WBC 4.6  4.0 - 10.5 K/uL   RBC 3.82 (*) 3.87 - 5.11 MIL/uL   Hemoglobin 12.1  12.0 - 15.0 g/dL   HCT 16.1 (*) 09.6 - 04.5 %   MCV 89.5  78.0 - 100.0 fL   MCH 31.7  26.0 - 34.0 pg   MCHC 35.4  30.0 - 36.0 g/dL   RDW 40.9  81.1 - 91.4 %   Platelets 163  150 - 400 K/uL   Neutrophils Relative % 63  43 - 77 %   Neutro Abs 2.9  1.7 - 7.7 K/uL   Lymphocytes Relative 30  12 - 46 %   Lymphs Abs 1.4  0.7 - 4.0 K/uL   Monocytes Relative 6  3 - 12 %  Monocytes Absolute 0.3  0.1 - 1.0 K/uL   Eosinophils Relative 0  0 - 5 %   Eosinophils Absolute 0.0  0.0 - 0.7 K/uL   Basophils Relative 0  0 - 1 %   Basophils Absolute 0.0  0.0 - 0.1 K/uL  COMPREHENSIVE METABOLIC PANEL     Status: Abnormal   Collection Time    01/12/13 11:35 AM      Result Value Range   Sodium 138  135 - 145 mEq/L   Potassium 3.5  3.5 - 5.1 mEq/L   Chloride 105  96 - 112 mEq/L   CO2 24  19 - 32 mEq/L   Glucose, Bld 78  70 - 99 mg/dL   BUN 18  6 - 23 mg/dL   Creatinine, Ser 4.13  0.50 - 1.10 mg/dL   Calcium 9.0  8.4 - 24.4 mg/dL   Total Protein 6.9  6.0 - 8.3 g/dL   Albumin 3.9  3.5 - 5.2 g/dL   AST 26  0 - 37 U/L   ALT 16  0 - 35 U/L   Alkaline Phosphatase 34 (*) 39 - 117 U/L   Total Bilirubin 0.6  0.3 - 1.2 mg/dL   GFR calc non Af Amer 70 (*) >90 mL/min   GFR calc Af Amer 81 (*) >90 mL/min   Comment: (NOTE)     The eGFR has been calculated using the CKD EPI equation.     This calculation has not been validated in all clinical situations.     eGFR's persistently <90 mL/min signify possible Chronic  Kidney     Disease.  POCT I-STAT TROPONIN I     Status: None   Collection Time    01/12/13 12:31 PM      Result Value Range   Troponin i, poc 0.00  0.00 - 0.08 ng/mL   Comment 3            Comment: Due to the release kinetics of cTnI,     a negative result within the first hours     of the onset of symptoms does not rule out     myocardial infarction with certainty.     If myocardial infarction is still suspected,     repeat the test at appropriate intervals.    Mr Brain Wo Contrast  01/12/2013   CLINICAL DATA:  42 year old female with left arm numbness. Left upper extremity pain.  EXAM: MRI HEAD WITHOUT CONTRAST  TECHNIQUE: Multiplanar, multisequence MR imaging was performed. No intravenous contrast was administered.  COMPARISON:  Cervical spine MRI from the same day reported separately. [facility] Head CT without contrast 01/21/2008.  FINDINGS: Cerebral volume is normal. No restricted diffusion to suggest acute infarction. No midline shift, mass effect, evidence of mass lesion, ventriculomegaly, extra-axial collection or acute intracranial hemorrhage. Cervicomedullary junction and pituitary are within normal limits. Major intracranial vascular flow voids are preserved.  Small anterior frontal lobe white matter foci of T2 and FLAIR hyperintensity, mildly advanced for age. Elsewhere normal gray and white matter signal. No cortical encephalomalacia. Chronic small dural calcification along the left convexity appears unchanged.  Visualized orbit soft tissues are within normal limits. Visualized paranasal sinuses and mastoids are clear. Normal visualized internal auditory structures. Visualized scalp soft tissues are within normal limits. Normal bone marrow signal.  IMPRESSION: 1. No acute intracranial abnormality.  2. Mild for age nonspecific frontal lobe white matter signal changes. Differential considerations include accelerated small vessel ischemia, sequelae of trauma, hypercoagulable  state, vasculitis, migraines, prior infection or demyelination.  3. Cervical spine MRI reported separately.   Electronically Signed   By: [name] M.D.   On: 01/12/2013 15:52   Mr Cervical Spine Wo Contrast  01/12/2013   CLINICAL DATA:  Left arm numbness. Soreness and tenderness.  EXAM: MRI CERVICAL SPINE WITHOUT CONTRAST  TECHNIQUE: Multiplanar, multisequence MR imaging was performed. No intravenous contrast was administered.  COMPARISON:  None.  FINDINGS: Cervical spinal alignment is anatomic. There are phleboliths present in both vertebral arteries. Cervical cord appears within normal limits. Posterior fossa and brain imaging is deferred to MRI brain today. Paraspinal soft tissues including prevertebral soft tissues are within normal limits. Mild respiratory motion artifact is present on axial image set. Mild disc desiccation is present throughout the cervical spine. Mild loss of disc height at C4-C5 and C5-C6.  C2-C3: Negative.  C3-C4: Minimal uncovertebral spurring. Shallow central disk protrusion without stenosis. Central canal and foramina are patent.  C4-C5: Broad-based disc bulging is present without significant central or foraminal stenosis. Mild right foraminal disc bulging and uncovertebral spurring without significant stenosis.  C5-C6: Broad-based central disc osteophyte complex with very mild central stenosis. No cord deformity. Bulging disk extends into the left neural foramen producing moderate left foraminal stenosis potentially affecting the left C6 nerve.  C6-C7: Negative.  C7-T1: Negative.  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 This produces moderate left foraminal stenosis that potentially affects the left C6 nerve.   Electronically Signed   By: Andreas Newport M.D.   On: 01/12/2013 15:54    Review of Systems  Constitutional:       Weakness of left upper extremity  HENT:  Positive for neck pain.   Eyes: Negative.   Respiratory: Negative.   Cardiovascular: Negative.   Gastrointestinal: Negative.   Genitourinary: Negative.   Skin: Negative.   Neurological: Positive for tingling, focal weakness and weakness.  Psychiatric/Behavioral: Negative.    Blood pressure 98/66, pulse 61, temperature 98.6 F (37 C), temperature source Oral, resp. rate 18, height 5\' 6"  (1.676 m), weight 58.968 kg (130 lb), SpO2 100.00%. Physical Exam  Constitutional: She is oriented to person, place, and time. She appears well-developed and well-nourished.  HENT:  Head: Normocephalic and atraumatic.  Eyes: Conjunctivae and EOM are normal. Pupils are equal, round, and reactive to light.  Neck: Neck supple.  Tenderness at supraclavicular fossa bilaterally worse on the left. Positive Spurling on left  Cardiovascular: Normal rate and regular rhythm.   Respiratory: Effort normal and breath sounds normal.  GI: Bowel sounds are normal.  Musculoskeletal:  Tenderness and dysesthesias in left upper extremity in region of bicep wrist extensors thumb and index finger  Neurological: She is alert and oriented to person, place, and time.  Absent biceps reflex on the left wrist extensors 3/5 grip strength is 3/5 biceps strength is 3/5 deltoids are 5 out of 5 triceps is 5 out of 5 on the left. Cranial nerve examination is normal the  Skin: Skin is warm and dry.  Psychiatric: She has a normal mood and affect. Her behavior is normal. Judgment and thought content normal.    Assessment/Plan: Patient has a left C6 radiculopathy with weakness in the bicep wrist extensor grip. She has numbness and dysesthesias in the left upper extremity. Her MRI was reviewed and this demonstrates disc degenerative changes at C5-C6 with the left foraminal stenosis. The situation has been discussed with the  patient I advised that we should try to treat this conservatively with a course of oral steroids to see if it helps calm  the inflammation and returned some of function it is successful then we can continue to treat her conservatively if not or if her pain should worsen despite the steroids and she ultimately may need surgical decompression of the disc herniation at C5-6 on the left. Because there is moderate amount of disc degenerative changes at otherwise I believe that she would best benefit from anterior cervical decompression and arthrodesis. Will follow her up as an outpatient. I have discussed her care with a PA and she is received Decadron 10 mg as a loading dose here she'll have a 10 mg prednisone Dosepak for 12 days.t  Stefani Dama 01/12/2013, 8:31 PM

## 2013-01-12 NOTE — ED Notes (Signed)
cbg 77  

## 2013-08-18 ENCOUNTER — Encounter (HOSPITAL_COMMUNITY): Payer: Self-pay | Admitting: Emergency Medicine

## 2013-08-18 ENCOUNTER — Emergency Department (HOSPITAL_COMMUNITY)
Admission: EM | Admit: 2013-08-18 | Discharge: 2013-08-18 | Disposition: A | Discharge status: Home / Self Care | Payer: Federal, State, Local not specified - PPO | Attending: Emergency Medicine | Admitting: Emergency Medicine

## 2013-08-18 ENCOUNTER — Emergency Department (HOSPITAL_COMMUNITY): Payer: Federal, State, Local not specified - PPO

## 2013-08-18 DIAGNOSIS — Z9851 Tubal ligation status: Secondary | ICD-10-CM | POA: Insufficient documentation

## 2013-08-18 DIAGNOSIS — G43709 Chronic migraine without aura, not intractable, without status migrainosus: Secondary | ICD-10-CM | POA: Insufficient documentation

## 2013-08-18 DIAGNOSIS — Z8719 Personal history of other diseases of the digestive system: Secondary | ICD-10-CM | POA: Insufficient documentation

## 2013-08-18 DIAGNOSIS — Z79899 Other long term (current) drug therapy: Secondary | ICD-10-CM | POA: Insufficient documentation

## 2013-08-18 DIAGNOSIS — Z90710 Acquired absence of both cervix and uterus: Secondary | ICD-10-CM | POA: Insufficient documentation

## 2013-08-18 DIAGNOSIS — N200 Calculus of kidney: Secondary | ICD-10-CM

## 2013-08-18 HISTORY — DX: Calculus of kidney: N20.0

## 2013-08-18 LAB — BASIC METABOLIC PANEL
BUN: 14 mg/dL (ref 6–23)
CO2: 25 mEq/L (ref 19–32)
Calcium: 9.3 mg/dL (ref 8.4–10.5)
Chloride: 104 mEq/L (ref 96–112)
Creatinine, Ser: 0.94 mg/dL (ref 0.50–1.10)
GFR calc Af Amer: 86 mL/min — ABNORMAL LOW (ref >90)
GFR calc non Af Amer: 74 mL/min — ABNORMAL LOW (ref >90)
Glucose, Bld: 99 mg/dL (ref 70–99)
Potassium: 4.2 mEq/L (ref 3.7–5.3)
Sodium: 141 mEq/L (ref 137–147)

## 2013-08-18 LAB — URINALYSIS, ROUTINE W REFLEX MICROSCOPIC
Bilirubin Urine: NEGATIVE
Glucose, UA: NEGATIVE mg/dL
Hgb urine dipstick: NEGATIVE
Ketones, ur: NEGATIVE mg/dL
Leukocytes, UA: NEGATIVE
Nitrite: NEGATIVE
Protein, ur: NEGATIVE mg/dL
Specific Gravity, Urine: 1.022 (ref 1.005–1.030)
Urobilinogen, UA: 0.2 mg/dL (ref 0.0–1.0)
pH: 7 (ref 5.0–8.0)

## 2013-08-18 LAB — CBC WITH DIFFERENTIAL/PLATELET
Basophils Absolute: 0 10*3/uL (ref 0.0–0.1)
Basophils Relative: 0 % (ref 0–1)
Eosinophils Absolute: 0 10*3/uL (ref 0.0–0.7)
Eosinophils Relative: 1 % (ref 0–5)
HCT: 38.9 % (ref 36.0–46.0)
Hemoglobin: 13.5 g/dL (ref 12.0–15.0)
Lymphocytes Relative: 29 % (ref 12–46)
Lymphs Abs: 1.2 10*3/uL (ref 0.7–4.0)
MCH: 31.5 pg (ref 26.0–34.0)
MCHC: 34.7 g/dL (ref 30.0–36.0)
MCV: 90.7 fL (ref 78.0–100.0)
Monocytes Absolute: 0.4 10*3/uL (ref 0.1–1.0)
Monocytes Relative: 9 % (ref 3–12)
Neutro Abs: 2.6 10*3/uL (ref 1.7–7.7)
Neutrophils Relative %: 61 % (ref 43–77)
Platelets: 168 10*3/uL (ref 150–400)
RBC: 4.29 MIL/uL (ref 3.87–5.11)
RDW: 12.3 % (ref 11.5–15.5)
WBC: 4.2 10*3/uL (ref 4.0–10.5)

## 2013-08-18 MED ORDER — KETOROLAC TROMETHAMINE 30 MG/ML IJ SOLN
30.0000 mg | Freq: Once | INTRAMUSCULAR | Status: AC
  Administered 2013-08-18: 30 mg via INTRAVENOUS
  Filled 2013-08-18: qty 1

## 2013-08-18 MED ORDER — HYDROMORPHONE HCL PF 1 MG/ML IJ SOLN
1.0000 mg | Freq: Once | INTRAMUSCULAR | Status: AC
  Administered 2013-08-18: 1 mg via INTRAVENOUS
  Filled 2013-08-18: qty 1

## 2013-08-18 MED ORDER — OXYCODONE-ACETAMINOPHEN 5-325 MG PO TABS: 1.0000 | ORAL_TABLET | Freq: Four times a day (QID) | ORAL | Status: DC | PRN

## 2013-08-18 MED ORDER — ONDANSETRON HCL 4 MG/2ML IJ SOLN
4.0000 mg | Freq: Once | INTRAMUSCULAR | Status: AC
  Administered 2013-08-18: 4 mg via INTRAVENOUS
  Filled 2013-08-18: qty 2

## 2013-08-18 MED ORDER — ONDANSETRON HCL 4 MG PO TABS: 4.0000 mg | ORAL_TABLET | Freq: Four times a day (QID) | ORAL | Status: DC

## 2013-08-18 MED ORDER — OXYCODONE-ACETAMINOPHEN 5-325 MG PO TABS
2.0000 | ORAL_TABLET | Freq: Once | ORAL | Status: AC
Start: 2013-08-18 — End: 2013-08-18
  Administered 2013-08-18: 2 via ORAL
  Filled 2013-08-18: qty 2

## 2013-08-18 NOTE — ED Notes (Signed)
Pt tolerated fluid well.

## 2013-08-18 NOTE — ED Provider Notes (Signed)
CSN: [id number]     Arrival date & time 08/18/13  0846 History   First MD Initiated Contact with Patient 08/18/13 0911     Chief Complaint  Patient presents with  . Flank Pain     (Consider location/radiation/quality/duration/timing/severity/associated sxs/prior Treatment) HPI Comments: Patient presents to the emergency department with chief complaint of left-sided flank pain. She states the pain started this morning when she woke up. She states the pain is severe. She reports associated nausea and vomiting. She states that she has had pain similar to this about a year ago from a kidney stone. She states that this stone passed without intervention. She states the pain radiates to her lower abdomen, and vagina. He denies any dysuria, vaginal discharge. She reports having hysterectomy about 10 years ago.  The history is provided by the patient. No language interpreter was used.    Past Medical History  Diagnosis Date  . Migraine     chronic, daily  . Temporomandibular joint disorders, unspecified   . Kidney stones    Past Surgical History  Procedure Laterality Date  . Tubal ligation    . Partial hysterectomy  1/07    Endometriosis; ovarian cyst (1/2 ovary on left)  . Knee cartilage surgery  1988    torn cartilage  . Abdominal hysterectomy    . Neck surgery     Family History  Problem Relation Age of Onset  . Diabetes Father   . Color blindness Father   . Hypertension Mother   . Diabetes      GM  . Ovarian cancer Paternal Grandmother   . Pancreatitis Paternal Grandfather   . Cataracts Paternal Grandmother   . Cataracts      Aunt  . Cataracts Son     childhood  . Hypertension Sister   . Melanoma      grandparent  . Migraines      uncle   History  Substance Use Topics  . Smoking status: Never Smoker   . Smokeless tobacco: Never Used  . Alcohol Use: No   OB History   Grav Para Term Preterm Abortions TAB SAB Ect Mult Living                 Review of Systems   Constitutional: Negative for fever and chills.  Respiratory: Negative for shortness of breath.   Cardiovascular: Negative for chest pain.  Gastrointestinal: Positive for nausea and vomiting. Negative for diarrhea and constipation.  Genitourinary: Positive for flank pain. Negative for dysuria.      Allergies  Morphine sulfate  Home Medications   Prior to Admission medications   Medication Sig Start Date End Date Taking? Authorizing Provider  cyclobenzaprine (FLEXERIL) 10 MG tablet Take 5-10 mg by mouth 3 (three) times daily as needed. For pain    Historical Provider, MD  ketoprofen (ORUDIS) 75 MG capsule Take 75 mg by mouth 3 (three) times daily as needed. For pain    Historical Provider, MD  levETIRAcetam (KEPPRA) 250 MG tablet Take 250-500 mg by mouth every 12 (twelve) hours. Takes 250 mg in am and 500 mg in pm. For migraines.    Historical Provider, MD  predniSONE (DELTASONE) 10 MG tablet Take 6 tab day 1 and 2, take 5 tab day 3 and 4, take 4 tabl day 5 and 6, take 3 tab day 7 and 8, take 2 tab day 9 and 10, take 1 tab day 11 and 12. 01/12/13   Renold Genta, PA-C  Prenatal  Vit-Fe Fumarate-FA (PRENATAL MULTIVITAMIN) TABS tablet Take 1 tablet by mouth daily at 12 noon.    Historical Provider, MD  topiramate (TOPAMAX) 200 MG tablet Take 200 mg by mouth at bedtime.     Historical Provider, MD   BP 104/70  Pulse 78  Temp(Src) 98.2 F (36.8 C) (Oral)  Ht 5' 6"  (1.676 m)  Wt 130 lb (58.968 kg)  BMI 20.99 kg/m2  SpO2 100% Physical Exam  Nursing note and vitals reviewed. Constitutional: She is oriented to person, place, and time. She appears well-developed and well-nourished.  Patient is obviously uncomfortable  HENT:  Head: Normocephalic and atraumatic.  Eyes: Conjunctivae and EOM are normal. Pupils are equal, round, and reactive to light.  Neck: Normal range of motion. Neck supple.  Cardiovascular: Normal rate and regular rhythm.  Exam reveals no gallop and no friction rub.    No murmur heard. Pulmonary/Chest: Effort normal and breath sounds normal. No respiratory distress. She has no wheezes. She has no rales. She exhibits no tenderness.  Abdominal: Soft. She exhibits no distension and no mass. There is no tenderness. There is no rebound and no guarding.  Positive left-sided CVA tenderness  Musculoskeletal: Normal range of motion. She exhibits no edema and no tenderness.  Neurological: She is alert and oriented to person, place, and time.  Skin: Skin is warm and dry.  Psychiatric: She has a normal mood and affect. Her behavior is normal. Judgment and thought content normal.    ED Course  Procedures (including critical care time) Results for orders placed during the hospital encounter of 08/18/13  CBC WITH DIFFERENTIAL      Result Value Ref Range   WBC 4.2  4.0 - 10.5 K/uL   RBC 4.29  3.87 - 5.11 MIL/uL   Hemoglobin 13.5  12.0 - 15.0 g/dL   HCT 38.9  36.0 - 46.0 %   MCV 90.7  78.0 - 100.0 fL   MCH 31.5  26.0 - 34.0 pg   MCHC 34.7  30.0 - 36.0 g/dL   RDW 12.3  11.5 - 15.5 %   Platelets 168  150 - 400 K/uL   Neutrophils Relative % 61  43 - 77 %   Neutro Abs 2.6  1.7 - 7.7 K/uL   Lymphocytes Relative 29  12 - 46 %   Lymphs Abs 1.2  0.7 - 4.0 K/uL   Monocytes Relative 9  3 - 12 %   Monocytes Absolute 0.4  0.1 - 1.0 K/uL   Eosinophils Relative 1  0 - 5 %   Eosinophils Absolute 0.0  0.0 - 0.7 K/uL   Basophils Relative 0  0 - 1 %   Basophils Absolute 0.0  0.0 - 0.1 K/uL  BASIC METABOLIC PANEL      Result Value Ref Range   Sodium 141  137 - 147 mEq/L   Potassium 4.2  3.7 - 5.3 mEq/L   Chloride 104  96 - 112 mEq/L   CO2 25  19 - 32 mEq/L   Glucose, Bld 99  70 - 99 mg/dL   BUN 14  6 - 23 mg/dL   Creatinine, Ser 0.94  0.50 - 1.10 mg/dL   Calcium 9.3  8.4 - 10.5 mg/dL   GFR calc non Af Amer 74 (*) >90 mL/min   GFR calc Af Amer 86 (*) >90 mL/min  URINALYSIS, ROUTINE W REFLEX MICROSCOPIC      Result Value Ref Range   Color, Urine YELLOW  YELLOW  APPearance CLOUDY (*) CLEAR   Specific Gravity, Urine 1.022  1.005 - 1.030   pH 7.0  5.0 - 8.0   Glucose, UA NEGATIVE  NEGATIVE mg/dL   Hgb urine dipstick NEGATIVE  NEGATIVE   Bilirubin Urine NEGATIVE  NEGATIVE   Ketones, ur NEGATIVE  NEGATIVE mg/dL   Protein, ur NEGATIVE  NEGATIVE mg/dL   Urobilinogen, UA 0.2  0.0 - 1.0 mg/dL   Nitrite NEGATIVE  NEGATIVE   Leukocytes, UA NEGATIVE  NEGATIVE   US Renal  08/18/2013   CLINICAL DATA:  Left flank pain and nausea and vomiting; history of previous urinary tract stones on the right  EXAM: RENAL/URINARY TRACT ULTRASOUND COMPLETE  COMPARISON:  CT ABD/PELV WO CM dated 08/28/2011  FINDINGS: Right Kidney:  Length: 10.8 cm . Echogenicity within normal limits. No mass or hydronephrosis visualized.  Left Kidney:  Length: 11.9 cm. There is mild to moderate hydronephrosis. No perinephric fluid collection is demonstrated.  Bladder:  Urinary bladder is decompressed.  IMPRESSION: There is mild to moderate hydronephrosis on the left. No discrete stone is demonstrated in the renal pelvis. The ureter is not visualized. The urinary bladder is decompressed and cannot be assessed adequately.   Electronically Signed   By: David  Martinique   On: 08/18/2013 11:33      EKG Interpretation None      MDM   Final diagnoses:  Kidney stone    Patient with probable kidney stone. Will treat pain, and check labs in urine. She has a history of kidney stones. This feels similar. Will hold off on imaging at this time. Her prior kidney stone passed without intervention.   12:28 PM Ultrasound remarkable for mild to moderate hydronephrosis. She has had kidney stones in the past. No intervention previously. Patient states her pain is improved. It is now 4/10. She is tolerating oral intake. Will discharge with pain medicine, and recommend urology followup. Discussed patient with Dr. Jeneen Rinks, who agrees with plan. Patient is stable and ready for discharge.   Montine Circle,  PA-C 08/18/13 1228

## 2013-08-18 NOTE — Discharge Instructions (Signed)
Kidney Stones Kidney stones (urolithiasis) are deposits that form inside your kidneys. The intense pain is caused by the stone moving through the urinary tract. When the stone moves, the ureter goes into spasm around the stone. The stone is usually passed in the urine.  CAUSES   A disorder that makes certain neck glands produce too much parathyroid hormone (primary hyperparathyroidism).  A buildup of uric acid crystals, similar to gout in your joints.  Narrowing (stricture) of the ureter.  A kidney obstruction present at birth (congenital obstruction).  Previous surgery on the kidney or ureters.  Numerous kidney infections. SYMPTOMS   Feeling sick to your stomach (nauseous).  Throwing up (vomiting).  Blood in the urine (hematuria).  Pain that usually spreads (radiates) to the groin.  Frequency or urgency of urination. DIAGNOSIS   Taking a history and physical exam.  Blood or urine tests.  CT scan.  Occasionally, an examination of the inside of the urinary bladder (cystoscopy) is performed. TREATMENT   Observation.  Increasing your fluid intake.  Extracorporeal shock wave lithotripsy This is a noninvasive procedure that uses shock waves to break up kidney stones.  Surgery may be needed if you have severe pain or persistent obstruction. There are various surgical procedures. Most of the procedures are performed with the use of small instruments. Only small incisions are needed to accommodate these instruments, so recovery time is minimized. The size, location, and chemical composition are all important variables that will determine the proper choice of action for you. Talk to your health care provider to better understand your situation so that you will minimize the risk of injury to yourself and your kidney.  HOME CARE INSTRUCTIONS   Drink enough water and fluids to keep your urine clear or pale yellow. This will help you to pass the stone or stone fragments.  Strain  all urine through the provided strainer. Keep all particulate matter and stones for your health care provider to see. The stone causing the pain may be as small as a grain of salt. It is very important to use the strainer each and every time you pass your urine. The collection of your stone will allow your health care provider to analyze it and verify that a stone has actually passed. The stone analysis will often identify what you can do to reduce the incidence of recurrences.  Only take over-the-counter or prescription medicines for pain, discomfort, or fever as directed by your health care provider.  Make a follow-up appointment with your health care provider as directed.  Get follow-up X-rays if required. The absence of pain does not always mean that the stone has passed. It may have only stopped moving. If the urine remains completely obstructed, it can cause loss of kidney function or even complete destruction of the kidney. It is your responsibility to make sure X-rays and follow-ups are completed. Ultrasounds of the kidney can show blockages and the status of the kidney. Ultrasounds are not associated with any radiation and can be performed easily in a matter of minutes. SEEK MEDICAL CARE IF:  You experience pain that is progressive and unresponsive to any pain medicine you have been prescribed. SEEK IMMEDIATE MEDICAL CARE IF:   Pain cannot be controlled with the prescribed medicine.  You have a fever or shaking chills.  The severity or intensity of pain increases over 18 hours and is not relieved by pain medicine.  You develop a new onset of abdominal pain.  You feel faint or pass  out.  You are unable to urinate. MAKE SURE YOU:   Understand these instructions.  Will watch your condition.  Will get help right away if you are not doing well or get worse. Document Released: 03/25/2005 Document Revised: 11/25/2012 Document Reviewed: 08/26/2012 [facility] Patient Information 2014  [place].

## 2013-08-18 NOTE — ED Notes (Signed)
Patient transported to Ultrasound 

## 2013-08-18 NOTE — ED Notes (Signed)
Pt from home with c/o left flank, lower abdominal, and vaginal pain starting this am.  Pt reports hx of kidney stone and that it feels the same.  Pt in NAD, A&O, ambulatory.

## 2013-08-19 NOTE — ED Provider Notes (Signed)
Medical screening examination/treatment/procedure(s) were performed by non-physician practitioner and as supervising physician I was immediately available for consultation/collaboration.   EKG Interpretation None        Tanna Furry, MD 08/19/13 1341

## 2014-05-09 ENCOUNTER — Encounter: Payer: Self-pay | Admitting: Family Medicine

## 2014-05-09 ENCOUNTER — Ambulatory Visit (INDEPENDENT_AMBULATORY_CARE_PROVIDER_SITE_OTHER): Payer: Federal, State, Local not specified - PPO | Admitting: Family Medicine

## 2014-05-09 VITALS — BP 112/76 | HR 52 | Temp 98.3°F | Ht 66.0 in | Wt 144.8 lb

## 2014-05-09 DIAGNOSIS — Z0282 Encounter for adoption services: Secondary | ICD-10-CM

## 2014-05-09 DIAGNOSIS — Z0289 Encounter for other administrative examinations: Secondary | ICD-10-CM | POA: Insufficient documentation

## 2014-05-09 DIAGNOSIS — Z7681 Expectant parent(s) prebirth pediatrician visit: Secondary | ICD-10-CM

## 2014-05-09 NOTE — Progress Notes (Signed)
Pre visit review using our clinic review tool, if applicable. No additional management support is needed unless otherwise documented below in the visit note. 

## 2014-05-09 NOTE — Assessment & Plan Note (Signed)
Pt will become a foster parent and then adopt  She has much experience including special needs and the blind  Healthy-no exp to comm dz and not high risk  Has had hysterectomy Will get mammogram at gyn  Declines flu shot   No restrictions for adoption- form filled out

## 2014-05-09 NOTE — Progress Notes (Signed)
Subjective:    Patient ID: [name], female    DOB: May 28, 1970, 44 y.o.   MRN: [id number]  HPI Here to re est for primary care   She is adopting  Will be a foster parents first and then adopt  79 year old in a group home who is legally blind    (she grew up around blind people and she knows braille)  She is experienced with special needs as well    Has not met the child yet - she is excited  He is in public school   Getting paperwork done now - will be finalized 1 year to 18 months  She is going through fostering classes Has cpr class  Will have case worker come and do a home study as well   Rest of her family is interested as well   Medically she has been well   Jan 2015  - had neck surgery for disc disease - a fusion and 3 cadaver discs  Went really well  Was being seen for chronic migraine and having shots up until then  Now headaches have almost stopped   Td was 07  Wt is good - up 14lb with bmi 19  Was underweight previously   Had a hysterectomy in 07 She tries to go once a year to gyn - does not get pap  Has had mammogram 2 years   Patient Active Problem List   Diagnosis Date Noted  . 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 COMMON MIGRAINE 01/13/2007   Past Medical History  Diagnosis Date  . Migraine     chronic, daily  . Temporomandibular joint disorders, unspecified   . Kidney stones    Past Surgical History  Procedure Laterality Date  . Tubal ligation    . Partial hysterectomy  1/07    Endometriosis; ovarian cyst (1/2 ovary on left)  . Knee cartilage surgery  1988    torn cartilage  . Abdominal hysterectomy    . Neck surgery     History  Substance Use Topics  . Smoking status: Never Smoker   . Smokeless tobacco: Never Used  . Alcohol Use: No   Family History  Problem Relation Age of Onset  . Diabetes Father   . Color blindness Father   . Hypertension Mother   . Diabetes      GM  . Ovarian cancer Paternal Grandmother   .  Pancreatitis Paternal Grandfather   . Cataracts Paternal Grandmother   . Cataracts      Aunt  . Cataracts Son     childhood  . Hypertension Sister   . Melanoma      grandparent  . Migraines      uncle   Allergies  Allergen Reactions  . Morphine Sulfate Itching    REACTION: unspecified   Current Outpatient Prescriptions on File Prior to Visit  Medication Sig Dispense Refill  . Multiple Vitamins-Minerals (MULTIVITAMIN WITH MINERALS) tablet Take 1 tablet by mouth daily.     No current facility-administered medications on file prior to visit.     Review of Systems Review of Systems  Constitutional: Negative for fever, appetite change, fatigue and unexpected weight change.  Eyes: Negative for pain and visual disturbance.  Respiratory: Negative for cough and shortness of breath.   Cardiovascular: Negative for cp or palpitations    Gastrointestinal: Negative for nausea, diarrhea and constipation.  Genitourinary: Negative for urgency and frequency.  Skin: Negative for pallor or rash  Neurological: Negative for weakness, light-headedness, numbness and headaches.  Hematological: Negative for adenopathy. Does not bruise/bleed easily.  Psychiatric/Behavioral: Negative for dysphoric mood. The patient is not nervous/anxious.         Objective:   Physical Exam  Constitutional: She appears well-developed and well-nourished. No distress.  HENT:  Head: Normocephalic and atraumatic.  Right Ear: External ear normal.  Left Ear: External ear normal.  Nose: Nose normal.  Mouth/Throat: Oropharynx is clear and moist.  Eyes: Conjunctivae and EOM are normal. Pupils are equal, round, and reactive to light. Right eye exhibits no discharge. Left eye exhibits no discharge. No scleral icterus.  Neck: Normal range of motion. Neck supple. No JVD present. No thyromegaly present.  Cardiovascular: Normal rate, regular rhythm, normal heart sounds and intact distal pulses.  Exam reveals no gallop.     Pulmonary/Chest: Effort normal and breath sounds normal. No respiratory distress. She has no wheezes. She has no rales.  Abdominal: Soft. Bowel sounds are normal. She exhibits no distension and no mass. There is no tenderness.  Musculoskeletal: She exhibits no edema or tenderness.  Lymphadenopathy:    She has no cervical adenopathy.  Neurological: She is alert. She has normal reflexes. No cranial nerve deficit. She exhibits normal muscle tone. Coordination normal.  Skin: Skin is warm and dry. No rash noted. No erythema. No pallor.  Psychiatric: She has a normal mood and affect.          Assessment & Plan:   Problem List Items Addressed This Visit      Other   Pre-adoption visit for adoptive parent(s) - Primary    Pt will become a foster parent and then adopt  She has much experience including special needs and the blind  Healthy-no exp to comm dz and not high risk  Has had hysterectomy Will get mammogram at gyn  Declines flu shot   No restrictions for adoption- form filled out

## 2014-05-09 NOTE — Patient Instructions (Signed)
I'm glad you are doing well  No restrictions for fostering or adopting Make sure to get you yearly mammogram   Let me know if you want a flu shot in the future

## 2014-06-13 ENCOUNTER — Other Ambulatory Visit: Payer: Self-pay

## 2014-06-14 LAB — CYTOLOGY - PAP

## 2014-06-21 LAB — HM MAMMOGRAPHY: HM Mammogram: NORMAL

## 2014-09-16 ENCOUNTER — Telehealth: Payer: Self-pay

## 2014-09-16 NOTE — Telephone Encounter (Signed)
Pt left v/m requesting copy of 05/2014 physical for adoption purposes. Pt will come in to office and sign record release.

## 2015-01-12 ENCOUNTER — Encounter: Payer: Self-pay | Admitting: Primary Care

## 2015-01-12 ENCOUNTER — Ambulatory Visit (INDEPENDENT_AMBULATORY_CARE_PROVIDER_SITE_OTHER): Payer: Federal, State, Local not specified - PPO | Admitting: Primary Care

## 2015-01-12 VITALS — BP 114/76 | HR 78 | Temp 97.9°F | Ht 66.0 in | Wt 150.8 lb

## 2015-01-12 DIAGNOSIS — J302 Other seasonal allergic rhinitis: Secondary | ICD-10-CM

## 2015-01-12 NOTE — Progress Notes (Signed)
Subjective:    Patient ID: [name], female    DOB: [birth year], 44 y.o.   MRN: [id number]  HPI  [name] is a 44 year old female who presents today with a chief complaint of cough. She also reports nasal congestion, sore throat, and sinus pressure. Her symptoms have been present since Monday this week. She's been taking store brand Claritin without improvement. Her symptoms have continued to get worse everyday. Her son was diagnosed with strep throat 2 weeks ago.  Review of Systems  Constitutional: Negative for fever and chills.  HENT: Positive for congestion, ear pain, postnasal drip, sinus pressure, sneezing and sore throat.   Respiratory: Positive for cough. Negative for shortness of breath.   Cardiovascular: Negative for chest pain.  Gastrointestinal: Negative for nausea.  Musculoskeletal: Negative for myalgias.       Past Medical History  Diagnosis Date  . Migraine     chronic, daily  . Temporomandibular joint disorders, unspecified   . Kidney stones     Social History   Social History  . Marital Status: Married    Spouse Name: N/A  . Number of Children: 3  . Years of Education: N/A   Occupational History  . Mail Carrier [employer]   Social History Main Topics  . Smoking status: Never Smoker   . Smokeless tobacco: Never Used  . Alcohol Use: No  . Drug Use: No  . Sexual Activity: Not on file   Other Topics Concern  . 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 Tubal ligation    . Partial hysterectomy  1/07    Endometriosis; ovarian cyst (1/2 ovary on left)  . Knee cartilage surgery  1988    torn cartilage  . Abdominal hysterectomy    . Neck surgery      Family History  Problem Relation Age of  Onset  . Diabetes Father   . Color blindness Father   . Hypertension Mother   . Diabetes      GM  . Ovarian cancer Paternal Grandmother   . Pancreatitis Paternal Grandfather   . Cataracts Paternal Grandmother   . Cataracts      Aunt  . Cataracts Son     childhood  . Hypertension Sister   . Melanoma      grandparent  . Migraines      uncle    Allergies  Allergen Reactions  . Morphine Sulfate Itching    REACTION: unspecified    Current Outpatient Prescriptions on File Prior to Visit  Medication Sig Dispense Refill  . Multiple Vitamins-Minerals (MULTIVITAMIN WITH MINERALS) tablet Take 1 tablet by mouth daily.     No current facility-administered medications on file prior to visit.    BP 114/76 mmHg  Pulse 78  Temp(Src) 97.9 F (36.6 C) (Oral)  Ht 5' 6"  (1.676 m)  Wt 150 lb 12.8 oz (68.402 kg)  BMI 24.35 kg/m2  SpO2 99%    Objective:   Physical Exam  Constitutional: She appears well-nourished.  HENT:  Right Ear: Ear canal normal. Tympanic membrane is not erythematous and not bulging. A middle ear effusion is present.  Left Ear: Tympanic  membrane and ear canal normal. Tympanic membrane is not erythematous and not bulging.  Nose: Right sinus exhibits maxillary sinus tenderness. Right sinus exhibits no frontal sinus tenderness. Left sinus exhibits maxillary sinus tenderness. Left sinus exhibits no frontal sinus tenderness.  Mouth/Throat: No oropharyngeal exudate, posterior oropharyngeal edema or posterior oropharyngeal erythema.  Cardiovascular: Normal rate and regular rhythm.   Pulmonary/Chest: Effort normal and breath sounds normal.  Skin: Skin is warm and dry.  Psychiatric: She has a normal mood and affect.          Assessment & Plan:  Allergy Symptoms:  Nasal congestion, sneezing, ear fullness, sinus pressure since Monday. Gradual worsening symptoms since Monday. No relief with OTC store brand claritin. Suspect allergies to be cause. Exam mostly  unremarkable. Lungs clear, throat WNL. Treat with supportive measures. Flonase, Delsym, ibuprofen, Zyrtec. She is to call me if no improvement on Monday.

## 2015-01-12 NOTE — Progress Notes (Signed)
Pre visit review using our clinic review tool, if applicable. No additional management support is needed unless otherwise documented below in the visit note. 

## 2015-01-12 NOTE — Patient Instructions (Signed)
Use Flonase (fluticasone) nasal spray for nasal congestion. Instill 2 sprays in each nostril once daily.  Continue to take your allergy pill. You may want to consider switching to brand name Zyrtec and take this at night.  If your cough becomes worse, try Delsym.  For sore throat and headache you may try ibuprofen. Take 600 to 800 mg three times daily.  Increase consumption of water. Rest.  Call me Monday if no improvement or if you develop fevers, chills, nausea, or symptoms become worse.  It was a pleasure to see you today!

## 2015-05-01 ENCOUNTER — Telehealth: Payer: Self-pay | Admitting: Family Medicine

## 2015-05-01 NOTE — Telephone Encounter (Signed)
Patient called to schedule physical with Dr.Tower.  Patient was told it would be August before we could make an appointment and she wanted to switch doctors.  Dr.Tower, do you want to fit patient in for a physical or do you want her to switch to another doctor?  She said when she calls for an appointment it's hard for her to get in with you.

## 2015-05-01 NOTE — Telephone Encounter (Signed)
Patient said she'd take the sooner appointment.  Patient is coming in on 05/22/15.

## 2015-05-01 NOTE — Telephone Encounter (Signed)
If she wants to change doctors for other reasons or due to the fact that I am so full-that is fine with me   If not -we can move her PE up -any 30 min slot (just please pick a day that I don't already have 7 or more physicals, thanks)

## 2015-05-10 ENCOUNTER — Emergency Department (HOSPITAL_COMMUNITY)
Admission: EM | Admit: 2015-05-10 | Discharge: 2015-05-10 | Disposition: A | Discharge status: Home / Self Care | Payer: Federal, State, Local not specified - PPO | Attending: Emergency Medicine | Admitting: Emergency Medicine

## 2015-05-10 ENCOUNTER — Emergency Department (HOSPITAL_COMMUNITY): Payer: Federal, State, Local not specified - PPO

## 2015-05-10 ENCOUNTER — Encounter (HOSPITAL_COMMUNITY): Payer: Self-pay

## 2015-05-10 DIAGNOSIS — M545 Low back pain: Secondary | ICD-10-CM | POA: Diagnosis present

## 2015-05-10 DIAGNOSIS — M25551 Pain in right hip: Secondary | ICD-10-CM | POA: Diagnosis not present

## 2015-05-10 DIAGNOSIS — M79604 Pain in right leg: Secondary | ICD-10-CM | POA: Diagnosis not present

## 2015-05-10 MED ORDER — OXYCODONE-ACETAMINOPHEN 5-325 MG PO TABS
ORAL_TABLET | ORAL | Status: AC
  Filled 2015-05-10: qty 1

## 2015-05-10 MED ORDER — OXYCODONE-ACETAMINOPHEN 5-325 MG PO TABS
1.0000 | ORAL_TABLET | Freq: Once | ORAL | Status: AC
  Administered 2015-05-10: 1 via ORAL

## 2015-05-10 NOTE — ED Notes (Signed)
Pt stopped at the desk to talk to this RN on her way out. Pt states she was hurting and no one had come in to see her. Apologized to pt and encouraged her to stay but she was ready to go home.

## 2015-05-10 NOTE — ED Notes (Signed)
Pt left with out being seen. This RN and Comptroller spoke with her about staying to see the doctor. Pt stated "she can sit at home in pain". Pt ambulated with steady gait out to the waiting room. MD made aware.

## 2015-05-10 NOTE — ED Notes (Signed)
Pt was bent down picking up sticks and rotated her right leg because one was sticking her and felt a pop in her right hip area. Unable to sit d/t pain. Has tried advil at 1800, warm bath, and heating pad. None of those have helped.

## 2015-05-13 ENCOUNTER — Telehealth: Payer: Self-pay | Admitting: Family Medicine

## 2015-05-13 DIAGNOSIS — Z0000 Encounter for general adult medical examination without abnormal findings: Secondary | ICD-10-CM

## 2015-05-13 NOTE — Telephone Encounter (Signed)
-----   Message from Ellamae Sia sent at 05/08/2015 10:00 AM EST ----- Regarding: Lab orders for Monday, 2.6.17 Patient is scheduled for CPX labs, please order future labs, Thanks , Karna Christmas

## 2015-05-15 ENCOUNTER — Other Ambulatory Visit (INDEPENDENT_AMBULATORY_CARE_PROVIDER_SITE_OTHER): Payer: Federal, State, Local not specified - PPO

## 2015-05-15 DIAGNOSIS — Z0000 Encounter for general adult medical examination without abnormal findings: Secondary | ICD-10-CM | POA: Diagnosis not present

## 2015-05-15 LAB — CBC WITH DIFFERENTIAL/PLATELET
Basophils Absolute: 0 10*3/uL (ref 0.0–0.1)
Basophils Relative: 0.6 % (ref 0.0–3.0)
Eosinophils Absolute: 0.1 10*3/uL (ref 0.0–0.7)
Eosinophils Relative: 3.1 % (ref 0.0–5.0)
HCT: 37.1 % (ref 36.0–46.0)
Hemoglobin: 12.5 g/dL (ref 12.0–15.0)
Lymphocytes Relative: 29.2 % (ref 12.0–46.0)
Lymphs Abs: 1 10*3/uL (ref 0.7–4.0)
MCHC: 33.7 g/dL (ref 30.0–36.0)
MCV: 90.8 fl (ref 78.0–100.0)
Monocytes Absolute: 0.5 10*3/uL (ref 0.1–1.0)
Monocytes Relative: 13.6 % — ABNORMAL HIGH (ref 3.0–12.0)
Neutro Abs: 1.9 10*3/uL (ref 1.4–7.7)
Neutrophils Relative %: 53.5 % (ref 43.0–77.0)
Platelets: 177 10*3/uL (ref 150.0–400.0)
RBC: 4.09 Mil/uL (ref 3.87–5.11)
RDW: 12.6 % (ref 11.5–15.5)
WBC: 3.5 10*3/uL — ABNORMAL LOW (ref 4.0–10.5)

## 2015-05-15 LAB — COMPREHENSIVE METABOLIC PANEL
ALT: 12 U/L (ref 0–35)
AST: 20 U/L (ref 0–37)
Albumin: 4 g/dL (ref 3.5–5.2)
Alkaline Phosphatase: 37 U/L — ABNORMAL LOW (ref 39–117)
BUN: 13 mg/dL (ref 6–23)
CO2: 28 mEq/L (ref 19–32)
Calcium: 9.2 mg/dL (ref 8.4–10.5)
Chloride: 104 mEq/L (ref 96–112)
Creatinine, Ser: 0.84 mg/dL (ref 0.40–1.20)
GFR: 78.29 mL/min (ref >60.00)
Glucose, Bld: 93 mg/dL (ref 70–99)
Potassium: 4.3 mEq/L (ref 3.5–5.1)
Sodium: 138 mEq/L (ref 135–145)
Total Bilirubin: 0.4 mg/dL (ref 0.2–1.2)
Total Protein: 6.7 g/dL (ref 6.0–8.3)

## 2015-05-15 LAB — LIPID PANEL
Cholesterol: 166 mg/dL (ref 0–200)
HDL: 55.4 mg/dL (ref >39.00)
LDL Cholesterol: 103 mg/dL — ABNORMAL HIGH (ref 0–99)
NonHDL: 111.09
Total CHOL/HDL Ratio: 3
Triglycerides: 41 mg/dL (ref 0.0–149.0)
VLDL: 8.2 mg/dL (ref 0.0–40.0)

## 2015-05-15 LAB — TSH: TSH: 0.97 u[IU]/mL (ref 0.35–4.50)

## 2015-05-22 ENCOUNTER — Encounter: Payer: Self-pay | Admitting: Family Medicine

## 2015-05-22 ENCOUNTER — Ambulatory Visit (INDEPENDENT_AMBULATORY_CARE_PROVIDER_SITE_OTHER): Payer: Federal, State, Local not specified - PPO | Admitting: Family Medicine

## 2015-05-22 VITALS — BP 106/62 | HR 72 | Temp 98.3°F | Ht 65.0 in | Wt 145.5 lb

## 2015-05-22 DIAGNOSIS — Z0000 Encounter for general adult medical examination without abnormal findings: Secondary | ICD-10-CM | POA: Diagnosis not present

## 2015-05-22 DIAGNOSIS — Z23 Encounter for immunization: Secondary | ICD-10-CM | POA: Diagnosis not present

## 2015-05-22 NOTE — Patient Instructions (Signed)
Tdap vaccine today  Take care of yourself  Labs look ok  Stay active and eat a healthy diet

## 2015-05-22 NOTE — Progress Notes (Signed)
Subjective:    Patient ID: [name], female    DOB: Jun 27, 1970, 45 y.o.   MRN: [id number]  HPI Here for health maintenance exam and to review chronic medical problems    Has been doing well overall  Needs paper work filled out for foster care again- needs to be updated  Still working on becoming a foster parent   Wt is up 5 lb with bmi of 24 Overall a healthy weight   HIV screen -not interested and now high risk  Td 1/07- due for that   Flu shot-declines  Pap 3/16 nl  Sees gyn Had a partial hysterectomy due to endometriosis in the past   Has her next appt already scheduled for that and mammogram in march   Results for orders placed or performed in visit on 05/15/15  CBC with Differential/Platelet  Result Value Ref Range   WBC 3.5 (L) 4.0 - 10.5 K/uL   RBC 4.09 3.87 - 5.11 Mil/uL   Hemoglobin 12.5 12.0 - 15.0 g/dL   HCT 37.1 36.0 - 46.0 %   MCV 90.8 78.0 - 100.0 fl   MCHC 33.7 30.0 - 36.0 g/dL   RDW 12.6 11.5 - 15.5 %   Platelets 177.0 150.0 - 400.0 K/uL   Neutrophils Relative % 53.5 43.0 - 77.0 %   Lymphocytes Relative 29.2 12.0 - 46.0 %   Monocytes Relative 13.6 (H) 3.0 - 12.0 %   Eosinophils Relative 3.1 0.0 - 5.0 %   Basophils Relative 0.6 0.0 - 3.0 %   Neutro Abs 1.9 1.4 - 7.7 K/uL   Lymphs Abs 1.0 0.7 - 4.0 K/uL   Monocytes Absolute 0.5 0.1 - 1.0 K/uL   Eosinophils Absolute 0.1 0.0 - 0.7 K/uL   Basophils Absolute 0.0 0.0 - 0.1 K/uL  Lipid panel  Result Value Ref Range   Cholesterol 166 0 - 200 mg/dL   Triglycerides 41.0 0.0 - 149.0 mg/dL   HDL 55.40 >39.00 mg/dL   VLDL 8.2 0.0 - 40.0 mg/dL   LDL Cholesterol 103 (H) 0 - 99 mg/dL   Total CHOL/HDL Ratio 3    NonHDL 111.09   Comprehensive metabolic panel  Result Value Ref Range   Sodium 138 135 - 145 mEq/L   Potassium 4.3 3.5 - 5.1 mEq/L   Chloride 104 96 - 112 mEq/L   CO2 28 19 - 32 mEq/L   Glucose, Bld 93 70 - 99 mg/dL   BUN 13 6 - 23 mg/dL   Creatinine, Ser 0.84 0.40 - 1.20 mg/dL   Total  Bilirubin 0.4 0.2 - 1.2 mg/dL   Alkaline Phosphatase 37 (L) 39 - 117 U/L   AST 20 0 - 37 U/L   ALT 12 0 - 35 U/L   Total Protein 6.7 6.0 - 8.3 g/dL   Albumin 4.0 3.5 - 5.2 g/dL   Calcium 9.2 8.4 - 10.5 mg/dL   GFR 78.29 >60.00 mL/min  TSH  Result Value Ref Range   TSH 0.97 0.35 - 4.50 uIU/mL     Patient Active Problem List   Diagnosis Date Noted  . Routine general medical examination at a health care facility 05/13/2015  . Pre-adoption visit for adoptive parent(s) 05/09/2014  . 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 COMMON MIGRAINE 01/13/2007   Past Medical History  Diagnosis Date  . Migraine     chronic, daily  . Temporomandibular joint disorders, unspecified   . Kidney stones    Past Surgical History  Procedure Laterality Date  .  Tubal ligation    . Partial hysterectomy  1/07    Endometriosis; ovarian cyst (1/2 ovary on left)  . Knee cartilage surgery  1988    torn cartilage  . Abdominal hysterectomy    . Neck surgery     Social History  Substance Use Topics  . Smoking status: Never Smoker   . Smokeless tobacco: Never Used  . Alcohol Use: No   Family History  Problem Relation Age of Onset  . Diabetes Father   . Color blindness Father   . Hypertension Mother   . Diabetes      GM  . Ovarian cancer Paternal Grandmother   . Pancreatitis Paternal Grandfather   . Cataracts Paternal Grandmother   . Cataracts      Aunt  . Cataracts Son     childhood  . Hypertension Sister   . Melanoma      grandparent  . Migraines      uncle   Allergies  Allergen Reactions  . Morphine Sulfate Itching    REACTION: unspecified   No current outpatient prescriptions on file prior to visit.   No current facility-administered medications on file prior to visit.    Review of Systems Review of Systems  Constitutional: Negative for fever, appetite change, fatigue and unexpected weight change.  Eyes: Negative for pain and visual disturbance.  Respiratory: Negative  for cough and shortness of breath.   Cardiovascular: Negative for cp or palpitations    Gastrointestinal: Negative for nausea, diarrhea and constipation.  Genitourinary: Negative for urgency and frequency.  Skin: Negative for pallor or rash   Neurological: Negative for weakness, light-headedness, numbness and headaches.  Hematological: Negative for adenopathy. Does not bruise/bleed easily.  Psychiatric/Behavioral: Negative for dysphoric mood. The patient is not nervous/anxious.         Objective:   Physical Exam  Constitutional: She appears well-developed and well-nourished. No distress.  Well appearing   HENT:  Head: Normocephalic and atraumatic.  Right Ear: External ear normal.  Left Ear: External ear normal.  Nose: Nose normal.  Mouth/Throat: Oropharynx is clear and moist.  Eyes: Conjunctivae and EOM are normal. Pupils are equal, round, and reactive to light. Right eye exhibits no discharge. Left eye exhibits no discharge. No scleral icterus.  Neck: Normal range of motion. Neck supple. No JVD present. Carotid bruit is not present. No thyromegaly present.  Cardiovascular: Normal rate, regular rhythm, normal heart sounds and intact distal pulses.  Exam reveals no gallop.   Pulmonary/Chest: Effort normal and breath sounds normal. No respiratory distress. She has no wheezes. She has no rales.  Abdominal: Soft. Bowel sounds are normal. She exhibits no distension and no mass. There is no tenderness.  Musculoskeletal: She exhibits no edema or tenderness.  Lymphadenopathy:    She has no cervical adenopathy.  Neurological: She is alert. She has normal reflexes. No cranial nerve deficit. She exhibits normal muscle tone. Coordination normal.  Skin: Skin is warm and dry. No rash noted. No erythema. No pallor.  Few solar lentigines    Psychiatric: She has a normal mood and affect.          Assessment & Plan:   Problem List Items Addressed This Visit      Other   Routine general  medical examination at a health care facility - Primary    Reviewed health habits including diet and exercise and skin cancer prevention Reviewed appropriate screening tests for age  Also reviewed health mt list, fam hx and immunization  status , as well as social and family history   See HPI Labs reviewed  Tdap today  No need for TB screen for soc service eval for adoption/no restrictions         Other Visit Diagnoses    Need for Tdap vaccination        Relevant Orders    Tdap vaccine greater than or equal to 7yo IM (Completed)

## 2015-05-22 NOTE — Assessment & Plan Note (Signed)
Reviewed health habits including diet and exercise and skin cancer prevention Reviewed appropriate screening tests for age  Also reviewed health mt list, fam hx and immunization status , as well as social and family history   See HPI Labs reviewed  Tdap today  No need for TB screen for soc service eval for adoption/no restrictions

## 2015-05-22 NOTE — Progress Notes (Signed)
Pre visit review using our clinic review tool, if applicable. No additional management support is needed unless otherwise documented below in the visit note. 

## 2015-05-23 ENCOUNTER — Encounter: Payer: Federal, State, Local not specified - PPO | Admitting: Family Medicine

## 2015-07-26 DIAGNOSIS — M5010 Cervical disc disorder with radiculopathy, unspecified cervical region: Secondary | ICD-10-CM | POA: Diagnosis not present

## 2015-07-26 DIAGNOSIS — M542 Cervicalgia: Secondary | ICD-10-CM | POA: Diagnosis not present

## 2015-07-28 DIAGNOSIS — M50221 Other cervical disc displacement at C4-C5 level: Secondary | ICD-10-CM | POA: Diagnosis not present

## 2015-07-28 DIAGNOSIS — M5010 Cervical disc disorder with radiculopathy, unspecified cervical region: Secondary | ICD-10-CM | POA: Diagnosis not present

## 2015-08-10 DIAGNOSIS — M5010 Cervical disc disorder with radiculopathy, unspecified cervical region: Secondary | ICD-10-CM | POA: Diagnosis not present

## 2015-10-02 DIAGNOSIS — K080 Exfoliation of teeth due to systemic causes: Secondary | ICD-10-CM | POA: Diagnosis not present

## 2015-11-07 ENCOUNTER — Telehealth: Payer: Self-pay | Admitting: Family Medicine

## 2015-11-07 NOTE — Telephone Encounter (Signed)
[facility]  Patient Name: [name]  DOB: 1970/09/21    Initial Comment headache head and neck pain radiating in to shoulders.   Nurse Assessment  Nurse: Wynetta Emery, RN, Baker Janus Date/Time Eilene Ghazi Time): 11/07/2015 10:55:56 AM  Confirm and document reason for call. If symptomatic, describe symptoms. You must click the next button to save text entered. ---[name] had surgery on neck 2.5 yrs ago and she is now having headaches two months with head and neck pain radiating in to her shoulders.  Has the patient traveled out of the country within the last 30 days? ---No  Does the patient have any new or worsening symptoms? ---Yes  Will a triage be completed? ---Yes  Related visit to physician within the last 2 weeks? ---No  Does the PT have any chronic conditions? (i.e. diabetes, asthma, etc.) ---No  Is the patient pregnant or possibly pregnant? (Ask all females between the ages of 60-55) ---No  Is this a behavioral health or substance abuse call? ---No     Guidelines    Guideline Title Affirmed Question Affirmed Notes  Headache [1] MODERATE headache (e.g., interferes with normal activities) AND [2] present > 24 hours AND [3] unexplained (Exceptions: analgesics not tried, typical migraine, or headache part of viral illness)    Final Disposition User   See Physician within [facility], RN, Baker Janus    Comments  NOTE: 8/05-2015 1045AM Arnette Norris MD APPT C/O headache, neckpain and shoulder pain NO Appts with PCP [facility] (out of office)   Referrals  GO TO FACILITY UNDECIDED   Disagree/Comply: Comply

## 2015-11-07 NOTE — Telephone Encounter (Signed)
Pt has appt Dr Deborra Medina on 11/08/15 at 10:45.

## 2015-11-08 ENCOUNTER — Ambulatory Visit (INDEPENDENT_AMBULATORY_CARE_PROVIDER_SITE_OTHER): Payer: Federal, State, Local not specified - PPO | Admitting: Family Medicine

## 2015-11-08 ENCOUNTER — Encounter: Payer: Self-pay | Admitting: Family Medicine

## 2015-11-08 VITALS — BP 112/68 | HR 79 | Temp 97.9°F | Wt 148.5 lb

## 2015-11-08 DIAGNOSIS — G43001 Migraine without aura, not intractable, with status migrainosus: Secondary | ICD-10-CM | POA: Diagnosis not present

## 2015-11-08 DIAGNOSIS — M542 Cervicalgia: Secondary | ICD-10-CM | POA: Insufficient documentation

## 2015-11-08 MED ORDER — CYCLOBENZAPRINE HCL 5 MG PO TABS: 5.0000 mg | ORAL_TABLET | Freq: Three times a day (TID) | ORAL | 1 refills | Status: DC | PRN

## 2015-11-08 NOTE — Assessment & Plan Note (Signed)
Reassured by recent visit with neurosurgery. Trapezius spasm is palpable- will try short course of flexeril prn.  Discussed sedation precautions, especially with her job.  Initially I want her to only take at night.  Continue massage.

## 2015-11-08 NOTE — Progress Notes (Signed)
Subjective:   Patient ID: [name], female    DOB: 06/21/1970, 45 y.o.   MRN: [id number]  [name] is a pleasant 45 y.o. year old female pt of Dr. Glori Bickers, new to me, who presents to clinic today with Headache and Neck Pain  on 11/08/2015  HPI:  2 months of headaches, neck pain radiating to her shoulders bilaterally.  Feels symptoms are progressing.  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  No UE weakness or tingling like she had prior to her neck surgery.  Reports having "neck surgery" two years ago- "cleaned bone spurs and added cadever discs."  Dr. Ellene Route. Per pt, had an xray and MRI of her neck 3-4 months ago.  Was told she has some narrowing of other discs but is not a surgical candidate. He told her to try yoga and massage which she has tried with little relief of symptoms.  H/o migraines- associated with photophobia- having a couple per month.  Takes Advil as abortive therapy.    No current outpatient prescriptions on file prior to visit.   No current facility-administered medications on file prior to visit.     Allergies  Allergen Reactions  . Morphine Sulfate Itching    REACTION: unspecified    Past Medical History:  Diagnosis Date  . Kidney stones   . Migraine    chronic, daily  . Temporomandibular joint disorders, unspecified     Past Surgical History:  Procedure Laterality Date  . ABDOMINAL HYSTERECTOMY    . KNEE CARTILAGE SURGERY  1988   torn cartilage  . NECK SURGERY    . PARTIAL HYSTERECTOMY  1/07   Endometriosis; ovarian cyst (1/2 ovary on left)  . TUBAL LIGATION      Family History  Problem Relation Age of Onset  . Diabetes Father   . Color blindness Father   . Hypertension Mother   . Diabetes      GM  . Ovarian cancer Paternal Grandmother   . Pancreatitis Paternal Grandfather   . Cataracts Paternal Grandmother   . Cataracts      Aunt  . Cataracts Son     childhood    . Hypertension Sister   . Melanoma      grandparent  . Migraines      uncle    Social History   Social History  . Marital status: Married    Spouse name: N/A  . Number of children: 3  . Years of education: N/A   Occupational History  . Mail Carrier [employer]   Social History Main Topics  . Smoking status: Never Smoker  . Smokeless tobacco: Never Used  . Alcohol use No  . Drug use: No  . Sexual activity: Not on file   Other Topics Concern  . 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   Review of Systems  Constitutional: Negative.   Musculoskeletal: Positive for myalgias and neck pain. Negative for neck stiffness.  Neurological: Positive for headaches. Negative for light-headedness and numbness.  All  other systems reviewed and are negative.      Objective:    BP 112/68   Pulse 79   Temp 97.9 F (36.6 C) (Oral)   Wt 148 lb 8 oz (67.4 kg)   SpO2 98%   BMI 24.71 kg/m    Physical Exam  Constitutional: She is oriented to person, place, and time. She appears well-developed and well-nourished. No distress.  HENT:  Head: Normocephalic.  Cardiovascular: Normal rate.   Pulmonary/Chest: Effort normal.  Musculoskeletal:       Arms: Neurological: She is alert and oriented to person, place, and time. No cranial nerve deficit.  Skin: Skin is warm and dry. She is not diaphoretic.  Psychiatric: She has a normal mood and affect. Her behavior is normal. Judgment and thought content normal.  Nursing note and vitals reviewed.         Assessment & Plan:   Migraine without aura and with status migrainosus, not intractable  Cervical  pain (neck) No Follow-up on file.

## 2015-11-08 NOTE — Progress Notes (Signed)
Pre visit review using our clinic review tool, if applicable. No additional management support is needed unless otherwise documented below in the visit note. 

## 2015-11-08 NOTE — Assessment & Plan Note (Signed)
Deteriorated likely due to msk spasms. Did advise following up with PCP to get on treatment plan if not improved- ie different abortive therapy to prevent rebound headaches and possible preventative rx. The patient indicates understanding of these issues and agrees with the plan.

## 2015-11-08 NOTE — Patient Instructions (Signed)
Great to see you. We are trying flexeril 5 mg - initially just at bedtime.  Please call me a couple of days with an update.

## 2015-12-18 ENCOUNTER — Encounter: Payer: Self-pay | Admitting: Family Medicine

## 2015-12-18 ENCOUNTER — Ambulatory Visit (INDEPENDENT_AMBULATORY_CARE_PROVIDER_SITE_OTHER): Payer: Federal, State, Local not specified - PPO | Admitting: Family Medicine

## 2015-12-18 VITALS — BP 98/62 | HR 69 | Temp 98.2°F | Ht 65.0 in | Wt 150.5 lb

## 2015-12-18 DIAGNOSIS — G43001 Migraine without aura, not intractable, with status migrainosus: Secondary | ICD-10-CM

## 2015-12-18 DIAGNOSIS — M25511 Pain in right shoulder: Secondary | ICD-10-CM | POA: Diagnosis not present

## 2015-12-18 DIAGNOSIS — M62838 Other muscle spasm: Secondary | ICD-10-CM

## 2015-12-18 DIAGNOSIS — M6248 Contracture of muscle, other site: Secondary | ICD-10-CM | POA: Diagnosis not present

## 2015-12-18 NOTE — Assessment & Plan Note (Signed)
Suspect tendonitis from repetitive work (overhead) at the [employer]  Enc use of ice nsaid prn  1/2 flexeril at night Ref to sport med for eval and tx

## 2015-12-18 NOTE — Assessment & Plan Note (Signed)
This seems to be worsened by neck spasm and shoulder pain from repetitive work overhead at the [employer]  Will address the muscular cause with sport me ref  Disc habits to prevent headache

## 2015-12-18 NOTE — Progress Notes (Signed)
Subjective:    Patient ID: [name], female    DOB: 21-Jan-1971, 45 y.o.   MRN: [id number]  HPI Here for several issues  Headaches are starting to come back - they start with spasm in neck and then move in   Physical job- she has R shoulder pain quite a bit and hands are starting to go numb She has to reach up and pull all day  Works at the post [employer]  Takes longer to do her job  Also has to twist and lean in her truck   ? If more of a shoulder than a neck issue   Has seen neurosurgery-has a deteriorating disc  (has already had surgery once) This one is not ready for surgery Yoga and pilates were recommended   Taking excedrin migraine for headaches- helps a bit   Patient Active Problem List   Diagnosis Date Noted  . Right shoulder pain 12/18/2015  . Trapezius muscle spasm 12/18/2015  . Cervical pain (neck) 11/08/2015  . Routine general medical examination at a health care facility 05/13/2015  . Pre-adoption visit for adoptive parent(s) 05/09/2014  . 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 Migraine without aura 01/13/2007   Past Medical History:  Diagnosis Date  . Kidney stones   . Migraine    chronic, daily  . Temporomandibular joint disorders, unspecified    Past Surgical History:  Procedure Laterality Date  . ABDOMINAL HYSTERECTOMY    . KNEE CARTILAGE SURGERY  1988   torn cartilage  . NECK SURGERY    . PARTIAL HYSTERECTOMY  1/07   Endometriosis; ovarian cyst (1/2 ovary on left)  . TUBAL LIGATION     Social History  Substance Use Topics  . Smoking status: Never Smoker  . Smokeless tobacco: Never Used  . Alcohol use No   Family History  Problem Relation Age of Onset  . Diabetes Father   . Color blindness Father   . Hypertension Mother   . Diabetes      GM  . Ovarian cancer Paternal Grandmother   . Pancreatitis Paternal Grandfather   . Cataracts Paternal Grandmother   . Cataracts      Aunt  . Cataracts Son     childhood  . Hypertension  Sister   . Melanoma      grandparent  . Migraines      uncle   Allergies  Allergen Reactions  . Morphine Sulfate Itching    REACTION: unspecified   No current outpatient prescriptions on file prior to visit.   No current facility-administered medications on file prior to visit.     Review of Systems Review of Systems  Constitutional: Negative for fever, appetite change, fatigue and unexpected weight change.  Eyes: Negative for pain and visual disturbance.  Respiratory: Negative for cough and shortness of breath.   Cardiovascular: Negative for cp or palpitations    Gastrointestinal: Negative for nausea, diarrhea and constipation.  Genitourinary: Negative for urgency and frequency.  Skin: Negative for pallor or rash   MSK pos for neck and L shoulder pain  Neurological: Negative for weakness, light-headedness, numbness and headaches.  Hematological: Negative for adenopathy. Does not bruise/bleed easily.  Psychiatric/Behavioral: Negative for dysphoric mood. The patient is not nervous/anxious.         Objective:   Physical Exam  Constitutional: She is oriented to person, place, and time. She appears well-developed and well-nourished. No distress.  Well appearing   HENT:  Head: Normocephalic and atraumatic.  Right Ear:  External ear normal.  Left Ear: External ear normal.  Nose: Nose normal.  Mouth/Throat: Oropharynx is clear and moist. No oropharyngeal exudate.  No sinus tenderness No temporal tenderness  No TMJ tenderness  Eyes: Conjunctivae and EOM are normal. Pupils are equal, round, and reactive to light. Right eye exhibits no discharge. Left eye exhibits no discharge. No scleral icterus.  No nystagmus  Neck: Normal range of motion and full passive range of motion without pain. Neck supple. No JVD present. Carotid bruit is not present. No tracheal deviation present. No thyromegaly present.  Cardiovascular: Normal rate, regular rhythm, normal heart sounds and intact distal  pulses.  Exam reveals no gallop.   No murmur heard. Pulmonary/Chest: Effort normal and breath sounds normal. No respiratory distress. She has no wheezes. She has no rales.  No crackles  Abdominal: Soft. Bowel sounds are normal. She exhibits no distension, no abdominal bruit and no mass. There is no tenderness.  Musculoskeletal: She exhibits no edema or tenderness.  Tight and tender trapezius muscles bilat  L shoulder= acromion tenderness w nl rom  Some apprehension with extension Pos hawking and Neer tests (mild)  Nl grip/perf and neuro fxn   Lymphadenopathy:    She has no cervical adenopathy.  Neurological: She is alert and oriented to person, place, and time. She has normal strength and normal reflexes. She displays no atrophy and no tremor. No cranial nerve deficit or sensory deficit. She exhibits normal muscle tone. She displays a negative Romberg sign. Coordination and gait normal.  No focal cerebellar signs   Skin: Skin is warm and dry. No rash noted. No pallor.  Psychiatric: She has a normal mood and affect.          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

## 2015-12-18 NOTE — Assessment & Plan Note (Signed)
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

## 2015-12-18 NOTE — Progress Notes (Signed)
Pre visit review using our clinic review tool, if applicable. No additional management support is needed unless otherwise documented below in the visit note. 

## 2015-12-18 NOTE — Patient Instructions (Addendum)
Start using ice on your shoulder and upper arm whenever you can (10 minutes at a time) - before and after work  Rest arm when not at work  Stop at check out to make appointment with Dr Lorelei Pont If you want to take a 1/2 flexeril at night- that is ok  Take 1-2 aleve with food (breakfast) before work- stop if it bothers your stomach

## 2015-12-25 ENCOUNTER — Ambulatory Visit: Payer: Federal, State, Local not specified - PPO | Admitting: Family Medicine

## 2016-01-04 ENCOUNTER — Ambulatory Visit (INDEPENDENT_AMBULATORY_CARE_PROVIDER_SITE_OTHER): Payer: Federal, State, Local not specified - PPO | Admitting: Family Medicine

## 2016-01-04 ENCOUNTER — Encounter: Payer: Self-pay | Admitting: Family Medicine

## 2016-01-04 VITALS — BP 122/80 | HR 78 | Temp 98.3°F | Ht 65.0 in | Wt 146.8 lb

## 2016-01-04 DIAGNOSIS — M7541 Impingement syndrome of right shoulder: Secondary | ICD-10-CM | POA: Diagnosis not present

## 2016-01-04 DIAGNOSIS — M5010 Cervical disc disorder with radiculopathy, unspecified cervical region: Secondary | ICD-10-CM

## 2016-01-04 DIAGNOSIS — G43009 Migraine without aura, not intractable, without status migrainosus: Secondary | ICD-10-CM

## 2016-01-04 DIAGNOSIS — M25511 Pain in right shoulder: Secondary | ICD-10-CM | POA: Diagnosis not present

## 2016-01-04 MED ORDER — METHYLPREDNISOLONE ACETATE 40 MG/ML IJ SUSP
80.0000 mg | Freq: Once | INTRAMUSCULAR | Status: AC
  Administered 2016-01-04: 80 mg via INTRA_ARTICULAR

## 2016-01-04 MED ORDER — AMITRIPTYLINE HCL 25 MG PO TABS: 25.0000 mg | ORAL_TABLET | Freq: Every day | ORAL | 2 refills | Status: DC

## 2016-01-04 NOTE — Progress Notes (Signed)
Pre visit review using our clinic review tool, if applicable. No additional management support is needed unless otherwise documented below in the visit note. 

## 2016-01-04 NOTE — Progress Notes (Signed)
Dr. Frederico Hamman T. Machi Whittaker, MD, [facility] [place], [address] Phone: [phone] Fax: [phone]  01/04/2016  Patient: [name], MRN: [id number], DOB: October 12, 1970, 45 y.o.  Primary Physician:  Loura Pardon, MD   Chief Complaint  Patient presents with  . Migraine    Pain radiate down neck & right shoulder   Subjective:   [name] is a 45 y.o. very pleasant female patient who presents with the following:  R neck pain: h/o neck surgery  Posterior neck and down the arm is hurting the most. ? If from migraines or from something different. Possibly a  Dr. Ellene Route - 2 years ago had two discs replaced.   Also has some migraines and these are in - and was taking some Topamax.  Sleeps off and on - 30 mins off and on.   She is having pain in her shoulder blade region, and this is also radiating down the entirety of her right arm.  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  She now has pain with abduction and with certain other movements while she is at work.  Past Medical History, Surgical History, Social History, Family History, Problem List, Medications, and Allergies have been reviewed and updated if relevant.  Patient Active Problem List   Diagnosis Date Noted  . Right shoulder pain 12/18/2015  . Trapezius muscle spasm 12/18/2015  . Cervical pain (neck) 11/08/2015  . Routine general medical examination at a health care facility 05/13/2015  . Pre-adoption visit for adoptive parent(s) 05/09/2014  . 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 Migraine without aura 01/13/2007    Past Medical History:  Diagnosis Date  . Kidney stones   . Migraine    chronic, daily  . Temporomandibular joint disorders, unspecified     Past Surgical History:  Procedure Laterality Date  . ABDOMINAL HYSTERECTOMY    .  KNEE CARTILAGE SURGERY  1988   torn cartilage  . NECK SURGERY    . PARTIAL HYSTERECTOMY  1/07   Endometriosis; ovarian cyst (1/2 ovary on left)  . TUBAL LIGATION      Social History   Social History  . Marital status: Married    Spouse name: N/A  . Number of children: 3  . Years of education: N/A   Occupational History  . Mail Carrier [employer]   Social History Main Topics  . Smoking status: Never Smoker  . Smokeless tobacco: Never Used  . Alcohol use No  . Drug use: No  . Sexual activity: Not on file   Other Topics Concern  . 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 Diabetes Father   . Color blindness Father   . Hypertension Mother   . Diabetes      GM  . Ovarian cancer Paternal Grandmother   . Pancreatitis Paternal Grandfather   . Cataracts Paternal Grandmother   . Cataracts      Aunt  . Cataracts Son     childhood  . Hypertension  Sister   . Melanoma      grandparent  . Migraines      uncle    Allergies  Allergen Reactions  . Morphine Sulfate Itching    REACTION: unspecified    Medication list reviewed and updated in full in [facility].  GEN: No fevers, chills. Nontoxic. Primarily MSK c/o today. MSK: Detailed in the HPI GI: tolerating PO intake without difficulty Neuro: No numbness, parasthesias, or tingling associated. Otherwise the pertinent positives of the ROS are noted above.   Objective:   BP 122/80   Pulse 78   Temp 98.3 F (36.8 C) (Oral)   Ht 5' 5"  (1.651 m)   Wt 146 lb 12 oz (66.6 kg)   BMI 24.42 kg/m    GEN: Well-developed,well-nourished,in no acute distress; alert,appropriate and cooperative throughout examination HEENT: Normocephalic and atraumatic without obvious  abnormalities. Ears, externally no deformities PULM: Breathing comfortably in no respiratory distress EXT: No clubbing, cyanosis, or edema PSYCH: Normally interactive. Cooperative during the interview. Pleasant. Friendly and conversant. Not anxious or depressed appearing. Normal, full affect.  Shoulder: R Inspection: No muscle wasting or winging Ecchymosis/edema: neg  AC joint, scapula, clavicle: NT Cervical spine: cervical spine range of motion is relatively limited with approximate 40% loss. Spurling's: + Abduction: full, 5/5 Flexion: full, 5/5 IR, full, lift-off: 5/5 ER at neutral: full, 5/5 AC crossover: neg Neer: pos Hawkins: pos Drop Test: neg Empty Can: pos Supraspinatus insertion: mild-mod T Bicipital groove: NT Speed's: neg Yergason's: neg Sulcus sign: neg Scapular dyskinesis: none C5-T1 intact  Neuro: Sensation intact Grip 5/5   Radiology: No results found.  Assessment and Plan:   Right shoulder pain - Plan: methylPREDNISolone acetate (DEPO-MEDROL) injection 80 mg  Impingement syndrome of right shoulder  Migraine without aura and without status migrainosus, not intractable  Cervical disc disorder with radiculopathy of cervical region  Multiple distinct issues.  Right-sided impingement with rotator cuff tendinopathy and subacromial bursitis.  Cervical radiculopathy on the right with referred pain to the shoulder plate and radiculopathy.  She also has chronic migraines and some insomnia.  I gave her a home rotator cuff  Rehabilitation program to work on most days of the week.  She seems to understand. Also going to start her on some Elavil to see if this helps with the radiculopathy.  This may also help with her migraines and certainly I would expect it would help with her sleep.  SubAC Injection, R Verbal consent was obtained from the patient. Risks (including rare infection), benefits, and alternatives were explained. Patient prepped with Chloraprep and  Ethyl Chloride used for anesthesia. The subacromial space was injected using the posterior approach. The patient tolerated the procedure well and had decreased pain post injection. No complications. Injection: 8 cc of Lidocaine 1% and 2 mL of Depo-Medrol 40 mg. Needle: 22 gauge   Follow-up: 6-8 weeks  New Prescriptions   AMITRIPTYLINE (ELAVIL) 25 MG TABLET    Take 1 tablet (25 mg total) by mouth at bedtime.   Signed,  Maud Deed. Madellyn Denio, MD   Patient's Medications  New Prescriptions   AMITRIPTYLINE (ELAVIL) 25 MG TABLET    Take 1 tablet (25 mg total) by mouth at bedtime.  Previous Medications   MULTIPLE VITAMINS-MINERALS (MULTIVITAMIN WITH MINERALS) TABLET    Take 1 tablet by mouth daily.  Modified Medications   No medications on file  Discontinued Medications   No medications on file

## 2016-02-19 ENCOUNTER — Ambulatory Visit: Payer: Federal, State, Local not specified - PPO | Admitting: Family Medicine

## 2016-02-19 DIAGNOSIS — Z0289 Encounter for other administrative examinations: Secondary | ICD-10-CM

## 2016-02-20 ENCOUNTER — Telehealth: Payer: Self-pay | Admitting: Family Medicine

## 2016-02-20 NOTE — Telephone Encounter (Signed)
Patient discretion

## 2016-02-20 NOTE — Telephone Encounter (Signed)
Patient did not come in for their appointment on 02/19/16 for 6 week follow up. Please let me know if patient needs to be contacted immediately for follow up or no follow up needed.

## 2016-04-22 ENCOUNTER — Encounter: Payer: Self-pay | Admitting: Family Medicine

## 2016-04-22 ENCOUNTER — Ambulatory Visit (INDEPENDENT_AMBULATORY_CARE_PROVIDER_SITE_OTHER)
Admission: RE | Admit: 2016-04-22 | Discharge: 2016-04-22 | Disposition: A | Discharge status: Home / Self Care | Payer: Federal, State, Local not specified - PPO | Source: Ambulatory Visit | Attending: Family Medicine | Admitting: Family Medicine

## 2016-04-22 ENCOUNTER — Ambulatory Visit (INDEPENDENT_AMBULATORY_CARE_PROVIDER_SITE_OTHER): Payer: Federal, State, Local not specified - PPO | Admitting: Family Medicine

## 2016-04-22 VITALS — BP 118/66 | HR 82 | Temp 98.2°F | Ht 65.0 in | Wt 152.2 lb

## 2016-04-22 DIAGNOSIS — M25552 Pain in left hip: Secondary | ICD-10-CM

## 2016-04-22 DIAGNOSIS — M542 Cervicalgia: Secondary | ICD-10-CM

## 2016-04-22 DIAGNOSIS — G43009 Migraine without aura, not intractable, without status migrainosus: Secondary | ICD-10-CM

## 2016-04-22 DIAGNOSIS — M25551 Pain in right hip: Secondary | ICD-10-CM | POA: Insufficient documentation

## 2016-04-22 DIAGNOSIS — M25559 Pain in unspecified hip: Secondary | ICD-10-CM | POA: Insufficient documentation

## 2016-04-22 MED ORDER — AMITRIPTYLINE HCL 10 MG PO TABS: 10.0000 mg | ORAL_TABLET | Freq: Every day | ORAL | 11 refills | Status: DC

## 2016-04-22 NOTE — Patient Instructions (Addendum)
Try amitriptyline 10 mg (or 1/2 pill if too sedating) - at night to help with sleep and headaches  Stop at check out regarding referral with Dr Ellene Route for your neck  Xray of hip today - we will get back to you with result when it returns   Take care of yourself

## 2016-04-22 NOTE — Progress Notes (Signed)
Subjective:    Patient ID: [name], female    DOB: 1971-01-02, 46 y.o.   MRN: [id number]  HPI Here for f/u of shoulder pain and neck pain /radiculopathy  (in the fall) Every time she tried to do exercises makes her hands go numb  Never got better  Has hx of deteriorated disc  Has had neck surgery in the past  Neuro surg- Dr Ellene Route (fusion 3 y ago)   Taking over the counter med for migraine  Makes her too groggy  Would like to try a lower dose   Now having pain in R hip (groin)  Sharp pain likes a "pinched nerve" and shoots down to her knee Usually fleeting during activity  Makes her limp when it happens  A little sore today   Patient Active Problem List   Diagnosis Date Noted  . Left hip pain 04/22/2016  . Right shoulder pain 12/18/2015  . Trapezius muscle spasm 12/18/2015  . Cervical pain (neck) 11/08/2015  . Routine general medical examination at a health care facility 05/13/2015  . Pre-adoption visit for adoptive parent(s) 05/09/2014  . 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 Migraine without aura 01/13/2007   Past Medical History:  Diagnosis Date  . Kidney stones   . Migraine    chronic, daily  . Temporomandibular joint disorders, unspecified    Past Surgical History:  Procedure Laterality Date  . ABDOMINAL HYSTERECTOMY    . KNEE CARTILAGE SURGERY  1988   torn cartilage  . NECK SURGERY    . PARTIAL HYSTERECTOMY  1/07   Endometriosis; ovarian cyst (1/2 ovary on left)  . TUBAL LIGATION     Social History  Substance Use Topics  . Smoking status: Never Smoker  . Smokeless tobacco: Never Used  . Alcohol use No   Family History  Problem Relation Age of Onset  . Diabetes Father   . Color blindness Father   . Hypertension Mother   . Diabetes      GM  . Ovarian cancer Paternal Grandmother   . Pancreatitis Paternal Grandfather   . Cataracts Paternal Grandmother   . Cataracts      Aunt  . Cataracts Son     childhood  . Hypertension Sister    . Melanoma      grandparent  . Migraines      uncle   Allergies  Allergen Reactions  . Morphine Sulfate Itching    REACTION: unspecified   Current Outpatient Prescriptions on File Prior to Visit  Medication Sig Dispense Refill  . Multiple Vitamins-Minerals (MULTIVITAMIN WITH MINERALS) tablet Take 1 tablet by mouth daily.     No current facility-administered medications on file prior to visit.      Review of Systems Review of Systems  Constitutional: Negative for fever, appetite change, fatigue and unexpected weight change.  Eyes: Negative for pain and visual disturbance.  Respiratory: Negative for cough and shortness of breath.   Cardiovascular: Negative for cp or palpitations    Gastrointestinal: Negative for nausea, diarrhea and constipation.  Genitourinary: Negative for urgency and frequency.  Skin: Negative for pallor or rash   MSK pos for neck pain that radiates to arms , pos for L groin pan  Neurological: Negative for weakness, light-headedness, numbness and headaches.  Hematological: Negative for adenopathy. Does not bruise/bleed easily.  Psychiatric/Behavioral: Negative for dysphoric mood. The patient is not nervous/anxious.         Objective:   Physical Exam  Constitutional: She appears well-developed  and well-nourished. No distress.  Well appearing   HENT:  Head: Normocephalic and atraumatic.  Eyes: Conjunctivae and EOM are normal. Pupils are equal, round, and reactive to light.  Neck: Neck supple. No thyromegaly present.  Cardiovascular: Normal rate and regular rhythm.   Pulmonary/Chest: Effort normal and breath sounds normal.  Musculoskeletal: She exhibits tenderness. She exhibits no edema or deformity.       Left hip: She exhibits normal range of motion, normal strength, no tenderness, no bony tenderness and no crepitus.       Cervical back: She exhibits decreased range of motion, tenderness, bony tenderness and spasm. She exhibits no edema and no  deformity.  Tender over lower cervical spines  No crepitus  Limited extension  Pain to rotate both directions   Nl slr  No LS tenderness No trochanteric tenderness Pain to internally rotate hip /nl ext rotation  Some discomfort over proximal/medial hamstring      Lymphadenopathy:    She has no cervical adenopathy.       Right: No inguinal adenopathy present.       Left: No inguinal adenopathy present.  Neurological: She has normal strength. She displays no atrophy. No sensory deficit. She exhibits normal muscle tone. Coordination and gait normal.  Neg SLR  Skin: Skin is warm and dry. No rash noted. No erythema.  Psychiatric: She has a normal mood and affect.          Assessment & Plan:   Problem List Items Addressed This Visit      Cardiovascular and Mediastinum   Migraine without aura - Primary    Intolerant of elevil 25-will decrease to 10 mg qhs and she can take 1/2 if this is still too sedating  Update if not helpful for sleep and migraine       Relevant Medications   amitriptyline (ELAVIL) 10 MG tablet     Other   Cervical pain (neck)    Ongoing and not responsive to conservative treatment  Hx of fusion in the past  Now c/o of radicular symptoms to hands and more constant discomfort Will ref back to her neuro surgeon for a re check      Relevant Orders   Ambulatory referral to Neurosurgery   Left hip pain    Pain in groin area - medial/prox hamstring tenderness and pain with int rot of hip  Suspect strain  Check xray today and update       Relevant Orders   DG HIP UNILAT WITH PELVIS 2-3 VIEWS LEFT (Completed)   RESOLVED: Right hip pain

## 2016-04-22 NOTE — Progress Notes (Signed)
Pre visit review using our clinic review tool, if applicable. No additional management support is needed unless otherwise documented below in the visit note. 

## 2016-04-22 NOTE — Assessment & Plan Note (Signed)
Intolerant of elevil 25-will decrease to 10 mg qhs and she can take 1/2 if this is still too sedating  Update if not helpful for sleep and migraine

## 2016-04-22 NOTE — Assessment & Plan Note (Signed)
Ongoing and not responsive to conservative treatment  Hx of fusion in the past  Now c/o of radicular symptoms to hands and more constant discomfort Will ref back to her neuro surgeon for a re check

## 2016-04-22 NOTE — Assessment & Plan Note (Signed)
Pain in groin area - medial/prox hamstring tenderness and pain with int rot of hip  Suspect strain  Check xray today and update

## 2016-05-11 ENCOUNTER — Telehealth: Payer: Self-pay | Admitting: Family Medicine

## 2016-05-11 DIAGNOSIS — Z0000 Encounter for general adult medical examination without abnormal findings: Secondary | ICD-10-CM

## 2016-05-11 NOTE — Telephone Encounter (Signed)
-----   Message from Ellamae Sia sent at 05/07/2016  2:38 PM EST ----- Regarding: Lab orders for Monday, 2.12.18 Patient is scheduled for CPX labs, please order future labs, Thanks , Karna Christmas

## 2016-05-20 ENCOUNTER — Other Ambulatory Visit (INDEPENDENT_AMBULATORY_CARE_PROVIDER_SITE_OTHER): Payer: Federal, State, Local not specified - PPO

## 2016-05-20 DIAGNOSIS — Z0000 Encounter for general adult medical examination without abnormal findings: Secondary | ICD-10-CM

## 2016-05-20 LAB — CBC WITH DIFFERENTIAL/PLATELET
Basophils Absolute: 0 10*3/uL (ref 0.0–0.1)
Basophils Relative: 0.9 % (ref 0.0–3.0)
Eosinophils Absolute: 0.1 10*3/uL (ref 0.0–0.7)
Eosinophils Relative: 1 % (ref 0.0–5.0)
HCT: 35 % — ABNORMAL LOW (ref 36.0–46.0)
Hemoglobin: 12.1 g/dL (ref 12.0–15.0)
Lymphocytes Relative: 28.5 % (ref 12.0–46.0)
Lymphs Abs: 1.4 10*3/uL (ref 0.7–4.0)
MCHC: 34.5 g/dL (ref 30.0–36.0)
MCV: 89.4 fl (ref 78.0–100.0)
Monocytes Absolute: 0.5 10*3/uL (ref 0.1–1.0)
Monocytes Relative: 9.9 % (ref 3.0–12.0)
Neutro Abs: 3 10*3/uL (ref 1.4–7.7)
Neutrophils Relative %: 59.7 % (ref 43.0–77.0)
Platelets: 247 10*3/uL (ref 150.0–400.0)
RBC: 3.91 Mil/uL (ref 3.87–5.11)
RDW: 12 % (ref 11.5–15.5)
WBC: 5 10*3/uL (ref 4.0–10.5)

## 2016-05-20 LAB — COMPREHENSIVE METABOLIC PANEL
ALT: 33 U/L (ref 0–35)
AST: 26 U/L (ref 0–37)
Albumin: 3.8 g/dL (ref 3.5–5.2)
Alkaline Phosphatase: 55 U/L (ref 39–117)
BUN: 12 mg/dL (ref 6–23)
CO2: 31 mEq/L (ref 19–32)
Calcium: 9.1 mg/dL (ref 8.4–10.5)
Chloride: 104 mEq/L (ref 96–112)
Creatinine, Ser: 0.75 mg/dL (ref 0.40–1.20)
GFR: 88.81 mL/min (ref >60.00)
Glucose, Bld: 94 mg/dL (ref 70–99)
Potassium: 3.9 mEq/L (ref 3.5–5.1)
Sodium: 140 mEq/L (ref 135–145)
Total Bilirubin: 0.5 mg/dL (ref 0.2–1.2)
Total Protein: 6.7 g/dL (ref 6.0–8.3)

## 2016-05-20 LAB — LIPID PANEL
Cholesterol: 148 mg/dL (ref 0–200)
HDL: 42.4 mg/dL (ref >39.00)
LDL Cholesterol: 90 mg/dL (ref 0–99)
NonHDL: 105.4
Total CHOL/HDL Ratio: 3
Triglycerides: 79 mg/dL (ref 0.0–149.0)
VLDL: 15.8 mg/dL (ref 0.0–40.0)

## 2016-05-20 LAB — TSH: TSH: 1.52 u[IU]/mL (ref 0.35–4.50)

## 2016-05-27 ENCOUNTER — Encounter: Payer: Federal, State, Local not specified - PPO | Admitting: Family Medicine

## 2016-05-27 ENCOUNTER — Encounter (INDEPENDENT_AMBULATORY_CARE_PROVIDER_SITE_OTHER): Payer: Self-pay

## 2016-06-03 DIAGNOSIS — K080 Exfoliation of teeth due to systemic causes: Secondary | ICD-10-CM | POA: Diagnosis not present

## 2016-06-05 DIAGNOSIS — G5603 Carpal tunnel syndrome, bilateral upper limbs: Secondary | ICD-10-CM | POA: Diagnosis not present

## 2016-06-05 DIAGNOSIS — M542 Cervicalgia: Secondary | ICD-10-CM | POA: Diagnosis not present

## 2016-06-13 DIAGNOSIS — H5310 Unspecified subjective visual disturbances: Secondary | ICD-10-CM | POA: Diagnosis not present

## 2016-06-13 DIAGNOSIS — H53143 Visual discomfort, bilateral: Secondary | ICD-10-CM | POA: Diagnosis not present

## 2016-06-19 ENCOUNTER — Encounter: Payer: Federal, State, Local not specified - PPO | Admitting: Family Medicine

## 2016-06-24 DIAGNOSIS — M79601 Pain in right arm: Secondary | ICD-10-CM | POA: Diagnosis not present

## 2016-06-24 DIAGNOSIS — R200 Anesthesia of skin: Secondary | ICD-10-CM | POA: Diagnosis not present

## 2016-06-24 DIAGNOSIS — M5020 Other cervical disc displacement, unspecified cervical region: Secondary | ICD-10-CM | POA: Diagnosis not present

## 2016-06-24 DIAGNOSIS — R202 Paresthesia of skin: Secondary | ICD-10-CM | POA: Diagnosis not present

## 2016-06-27 DIAGNOSIS — G935 Compression of brain: Secondary | ICD-10-CM | POA: Diagnosis not present

## 2016-07-01 DIAGNOSIS — Z6824 Body mass index (BMI) 24.0-24.9, adult: Secondary | ICD-10-CM | POA: Diagnosis not present

## 2016-07-01 DIAGNOSIS — K080 Exfoliation of teeth due to systemic causes: Secondary | ICD-10-CM | POA: Diagnosis not present

## 2016-07-01 DIAGNOSIS — Z1231 Encounter for screening mammogram for malignant neoplasm of breast: Secondary | ICD-10-CM | POA: Diagnosis not present

## 2016-07-01 DIAGNOSIS — Z01419 Encounter for gynecological examination (general) (routine) without abnormal findings: Secondary | ICD-10-CM | POA: Diagnosis not present

## 2016-07-15 DIAGNOSIS — K080 Exfoliation of teeth due to systemic causes: Secondary | ICD-10-CM | POA: Diagnosis not present

## 2016-07-25 DIAGNOSIS — R55 Syncope and collapse: Secondary | ICD-10-CM | POA: Diagnosis not present

## 2016-07-25 DIAGNOSIS — H538 Other visual disturbances: Secondary | ICD-10-CM | POA: Diagnosis not present

## 2016-07-25 DIAGNOSIS — G935 Compression of brain: Secondary | ICD-10-CM | POA: Diagnosis not present

## 2016-08-15 DIAGNOSIS — G43809 Other migraine, not intractable, without status migrainosus: Secondary | ICD-10-CM | POA: Diagnosis not present

## 2016-10-21 DIAGNOSIS — G43709 Chronic migraine without aura, not intractable, without status migrainosus: Secondary | ICD-10-CM | POA: Diagnosis not present

## 2016-11-26 DIAGNOSIS — G43709 Chronic migraine without aura, not intractable, without status migrainosus: Secondary | ICD-10-CM | POA: Diagnosis not present

## 2016-12-02 DIAGNOSIS — K080 Exfoliation of teeth due to systemic causes: Secondary | ICD-10-CM | POA: Diagnosis not present

## 2016-12-04 ENCOUNTER — Encounter: Payer: Self-pay | Admitting: Family Medicine

## 2016-12-04 ENCOUNTER — Ambulatory Visit (INDEPENDENT_AMBULATORY_CARE_PROVIDER_SITE_OTHER): Payer: Federal, State, Local not specified - PPO | Admitting: Family Medicine

## 2016-12-04 ENCOUNTER — Encounter: Payer: Federal, State, Local not specified - PPO | Admitting: Family Medicine

## 2016-12-04 VITALS — BP 90/60 | HR 80 | Temp 98.1°F | Ht 64.75 in | Wt 145.2 lb

## 2016-12-04 DIAGNOSIS — Z0000 Encounter for general adult medical examination without abnormal findings: Secondary | ICD-10-CM

## 2016-12-04 NOTE — Patient Instructions (Signed)
Wellness labs today  Follow up with Dr Ellene Route when you can regarding your neck and work   Take care  Try to eat more regularly (healthy diet) Wear your sunscreen   We will send for your last gyn note and mammogram

## 2016-12-04 NOTE — Progress Notes (Signed)
Subjective:    Patient ID: [name], female    DOB: 1970/06/02, 46 y.o.   MRN: [id number]  HPI Here for health maintenance exam and to review chronic medical problems    Doing ok overall - struggles with neck and head pain   Things are going well at home  No kids in the house any more    Wt Readings from Last 3 Encounters:  12/04/16 145 lb 4 oz (65.9 kg)  04/22/16 152 lb 4 oz (69.1 kg)  01/04/16 146 lb 12 oz (66.6 kg)  some weight loss  She has not been very hungry = eating healthy and eating less  24.36 kg/m  Mammogram 3/18 (nl at gyn) Had it in march - well sign release to get info from Dr Nori Riis Self breast exam -no breast lumps   Declines flu shots    Pap 3/18 nl with Dr Nori Riis Has had a partial hysterectomy (1/2 ovary left) for endometriosis   Tdap 2/17  Going to a headache clinic for migraines (Dr Olevia Bowens)   Reubin Milan find the immunoglobulin shot in any pharmacy yet  Headaches are starting to coming back  Her neck is worse- another disc is deteriorating and bone spurs  (not surgical at this point) - she sees Dr Ellene Route (monitoring her)  This affects her job performance   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 Left hip pain 04/22/2016  . Right shoulder pain 12/18/2015  . Cervical pain (neck) 11/08/2015  . Routine general medical examination at a health care facility 05/13/2015  . Pre-adoption visit for adoptive parent(s) 05/09/2014  . 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 Migraine without aura 01/13/2007   Past Medical History:  Diagnosis Date  . Kidney stones   . Migraine    chronic, daily  . Temporomandibular joint disorders, unspecified    Past Surgical History:  Procedure Laterality Date  . ABDOMINAL HYSTERECTOMY    . KNEE CARTILAGE SURGERY   1988   torn cartilage  . NECK SURGERY    . PARTIAL HYSTERECTOMY  1/07   Endometriosis; ovarian cyst (1/2 ovary on left)  . TUBAL LIGATION     Social History  Substance Use Topics  . Smoking status: Never Smoker  . Smokeless tobacco: Never Used  . Alcohol use No   Family History  Problem Relation Age of Onset  . Diabetes Father   . Color blindness Father   . Hypertension Mother   . Diabetes Unknown        GM  . Ovarian cancer Paternal Grandmother   . Pancreatitis Paternal Grandfather   . Cataracts Paternal Grandmother   . Cataracts Unknown        Aunt  . Cataracts Son        childhood  . Hypertension Sister   . Melanoma Unknown        grandparent  . Migraines Unknown        uncle   Allergies  Allergen Reactions  . Morphine Sulfate Itching    REACTION: unspecified   Current Outpatient Prescriptions on File Prior to Visit  Medication Sig Dispense Refill  . Multiple Vitamins-Minerals (MULTIVITAMIN WITH MINERALS) tablet Take 1 tablet by mouth daily.     No current  facility-administered medications on file prior to visit.     Review of Systems Review of Systems  Constitutional: Negative for fever, appetite change, fatigue and unexpected weight change.  Eyes: Negative for pain and visual disturbance.  Respiratory: Negative for cough and shortness of breath.   Cardiovascular: Negative for cp or palpitations    Gastrointestinal: Negative for nausea, diarrhea and constipation.  Genitourinary: Negative for urgency and frequency.  Skin: Negative for pallor or rash   MSK pos for chronic neck pain  Neurological: Negative for weakness, light-headedness, numbness and pos for headaches.  Hematological: Negative for adenopathy. Does not bruise/bleed easily.  Psychiatric/Behavioral: Negative for dysphoric mood. The patient is not nervous/anxious.         Objective:   Physical Exam  Constitutional: She appears well-developed and well-nourished. No distress.  Well appearing    HENT:  Head: Normocephalic and atraumatic.  Right Ear: External ear normal.  Left Ear: External ear normal.  Nose: Nose normal.  Mouth/Throat: Oropharynx is clear and moist.  Eyes: Pupils are equal, round, and reactive to light. Conjunctivae and EOM are normal. Right eye exhibits no discharge. Left eye exhibits no discharge. No scleral icterus.  Neck: Normal range of motion. Neck supple. No JVD present. Carotid bruit is not present. No thyromegaly present.  Cardiovascular: Normal rate, regular rhythm, normal heart sounds and intact distal pulses.  Exam reveals no gallop.   Pulmonary/Chest: Effort normal and breath sounds normal. No respiratory distress. She has no wheezes. She has no rales.  Abdominal: Soft. Bowel sounds are normal. She exhibits no distension and no mass. There is no tenderness.  Musculoskeletal: She exhibits no edema or tenderness.  Limited rom of neck   Lymphadenopathy:    She has no cervical adenopathy.  Neurological: She is alert. She has normal reflexes. No cranial nerve deficit. She exhibits normal muscle tone. Coordination normal.  Skin: Skin is warm and dry. No rash noted. No erythema. No pallor.  Tanned Some lentigines  Psychiatric: She has a normal mood and affect.  Pleasant   Tearful when discussing her neck pain and headaches voicing concern about work          Assessment & Plan:   Problem List Items Addressed This Visit      Other   Routine general medical examination at a health care facility - Primary    Reviewed health habits including diet and exercise and skin cancer prevention Reviewed appropriate screening tests for age  Also reviewed health mt list, fam hx and immunization status , as well as social and family history   See HPI Labs ordered Good health habits  Will f/u with neurosurg regarding neck       Relevant Orders   CBC with Differential/Platelet   Comprehensive metabolic panel   Lipid panel   TSH

## 2016-12-05 LAB — CBC WITH DIFFERENTIAL/PLATELET
Basophils Absolute: 0.1 10*3/uL (ref 0.0–0.1)
Basophils Relative: 1.1 % (ref 0.0–3.0)
Eosinophils Absolute: 0.1 10*3/uL (ref 0.0–0.7)
Eosinophils Relative: 0.9 % (ref 0.0–5.0)
HCT: 39.5 % (ref 36.0–46.0)
Hemoglobin: 13.5 g/dL (ref 12.0–15.0)
Lymphocytes Relative: 28.1 % (ref 12.0–46.0)
Lymphs Abs: 2.1 10*3/uL (ref 0.7–4.0)
MCHC: 34.1 g/dL (ref 30.0–36.0)
MCV: 92.7 fl (ref 78.0–100.0)
Monocytes Absolute: 0.6 10*3/uL (ref 0.1–1.0)
Monocytes Relative: 8.2 % (ref 3.0–12.0)
Neutro Abs: 4.5 10*3/uL (ref 1.4–7.7)
Neutrophils Relative %: 61.7 % (ref 43.0–77.0)
Platelets: 203 10*3/uL (ref 150.0–400.0)
RBC: 4.26 Mil/uL (ref 3.87–5.11)
RDW: 12.9 % (ref 11.5–15.5)
WBC: 7.3 10*3/uL (ref 4.0–10.5)

## 2016-12-05 LAB — COMPREHENSIVE METABOLIC PANEL
ALT: 14 U/L (ref 0–35)
AST: 22 U/L (ref 0–37)
Albumin: 4.5 g/dL (ref 3.5–5.2)
Alkaline Phosphatase: 38 U/L — ABNORMAL LOW (ref 39–117)
BUN: 11 mg/dL (ref 6–23)
CO2: 27 mEq/L (ref 19–32)
Calcium: 9.6 mg/dL (ref 8.4–10.5)
Chloride: 105 mEq/L (ref 96–112)
Creatinine, Ser: 0.93 mg/dL (ref 0.40–1.20)
GFR: 69.12 mL/min (ref >60.00)
Glucose, Bld: 88 mg/dL (ref 70–99)
Potassium: 3.9 mEq/L (ref 3.5–5.1)
Sodium: 138 mEq/L (ref 135–145)
Total Bilirubin: 0.4 mg/dL (ref 0.2–1.2)
Total Protein: 7.1 g/dL (ref 6.0–8.3)

## 2016-12-05 LAB — LIPID PANEL
Cholesterol: 181 mg/dL (ref 0–200)
HDL: 52.7 mg/dL (ref >39.00)
LDL Cholesterol: 102 mg/dL — ABNORMAL HIGH (ref 0–99)
NonHDL: 127.95
Total CHOL/HDL Ratio: 3
Triglycerides: 130 mg/dL (ref 0.0–149.0)
VLDL: 26 mg/dL (ref 0.0–40.0)

## 2016-12-05 LAB — TSH: TSH: 1.53 u[IU]/mL (ref 0.35–4.50)

## 2016-12-05 NOTE — Assessment & Plan Note (Signed)
Reviewed health habits including diet and exercise and skin cancer prevention Reviewed appropriate screening tests for age  Also reviewed health mt list, fam hx and immunization status , as well as social and family history   See HPI Labs ordered Good health habits  Will f/u with neurosurg regarding neck

## 2016-12-30 DIAGNOSIS — K080 Exfoliation of teeth due to systemic causes: Secondary | ICD-10-CM | POA: Diagnosis not present

## 2017-02-24 DIAGNOSIS — G43709 Chronic migraine without aura, not intractable, without status migrainosus: Secondary | ICD-10-CM | POA: Diagnosis not present

## 2017-02-24 DIAGNOSIS — M542 Cervicalgia: Secondary | ICD-10-CM | POA: Diagnosis not present

## 2017-03-24 DIAGNOSIS — M25612 Stiffness of left shoulder, not elsewhere classified: Secondary | ICD-10-CM | POA: Diagnosis not present

## 2017-03-24 DIAGNOSIS — M542 Cervicalgia: Secondary | ICD-10-CM | POA: Diagnosis not present

## 2017-03-24 DIAGNOSIS — M6281 Muscle weakness (generalized): Secondary | ICD-10-CM | POA: Diagnosis not present

## 2017-03-24 DIAGNOSIS — M25611 Stiffness of right shoulder, not elsewhere classified: Secondary | ICD-10-CM | POA: Diagnosis not present

## 2017-04-03 DIAGNOSIS — M542 Cervicalgia: Secondary | ICD-10-CM | POA: Diagnosis not present

## 2017-04-03 DIAGNOSIS — M5010 Cervical disc disorder with radiculopathy, unspecified cervical region: Secondary | ICD-10-CM | POA: Diagnosis not present

## 2017-04-07 DIAGNOSIS — M542 Cervicalgia: Secondary | ICD-10-CM | POA: Diagnosis not present

## 2017-04-07 DIAGNOSIS — M25611 Stiffness of right shoulder, not elsewhere classified: Secondary | ICD-10-CM | POA: Diagnosis not present

## 2017-04-07 DIAGNOSIS — M6281 Muscle weakness (generalized): Secondary | ICD-10-CM | POA: Diagnosis not present

## 2017-04-07 DIAGNOSIS — M25612 Stiffness of left shoulder, not elsewhere classified: Secondary | ICD-10-CM | POA: Diagnosis not present

## 2017-04-10 DIAGNOSIS — M542 Cervicalgia: Secondary | ICD-10-CM | POA: Diagnosis not present

## 2017-04-10 DIAGNOSIS — M6281 Muscle weakness (generalized): Secondary | ICD-10-CM | POA: Diagnosis not present

## 2017-04-10 DIAGNOSIS — M25611 Stiffness of right shoulder, not elsewhere classified: Secondary | ICD-10-CM | POA: Diagnosis not present

## 2017-04-10 DIAGNOSIS — M25612 Stiffness of left shoulder, not elsewhere classified: Secondary | ICD-10-CM | POA: Diagnosis not present

## 2017-04-14 DIAGNOSIS — M542 Cervicalgia: Secondary | ICD-10-CM | POA: Diagnosis not present

## 2017-04-14 DIAGNOSIS — M25611 Stiffness of right shoulder, not elsewhere classified: Secondary | ICD-10-CM | POA: Diagnosis not present

## 2017-04-14 DIAGNOSIS — M6281 Muscle weakness (generalized): Secondary | ICD-10-CM | POA: Diagnosis not present

## 2017-04-14 DIAGNOSIS — M25612 Stiffness of left shoulder, not elsewhere classified: Secondary | ICD-10-CM | POA: Diagnosis not present

## 2017-04-15 DIAGNOSIS — M25612 Stiffness of left shoulder, not elsewhere classified: Secondary | ICD-10-CM | POA: Diagnosis not present

## 2017-04-15 DIAGNOSIS — M542 Cervicalgia: Secondary | ICD-10-CM | POA: Diagnosis not present

## 2017-04-15 DIAGNOSIS — M6281 Muscle weakness (generalized): Secondary | ICD-10-CM | POA: Diagnosis not present

## 2017-04-15 DIAGNOSIS — M25611 Stiffness of right shoulder, not elsewhere classified: Secondary | ICD-10-CM | POA: Diagnosis not present

## 2017-04-24 DIAGNOSIS — M25611 Stiffness of right shoulder, not elsewhere classified: Secondary | ICD-10-CM | POA: Diagnosis not present

## 2017-04-24 DIAGNOSIS — M6281 Muscle weakness (generalized): Secondary | ICD-10-CM | POA: Diagnosis not present

## 2017-04-24 DIAGNOSIS — M25612 Stiffness of left shoulder, not elsewhere classified: Secondary | ICD-10-CM | POA: Diagnosis not present

## 2017-04-24 DIAGNOSIS — M542 Cervicalgia: Secondary | ICD-10-CM | POA: Diagnosis not present

## 2017-04-28 DIAGNOSIS — M542 Cervicalgia: Secondary | ICD-10-CM | POA: Diagnosis not present

## 2017-04-28 DIAGNOSIS — M6281 Muscle weakness (generalized): Secondary | ICD-10-CM | POA: Diagnosis not present

## 2017-04-28 DIAGNOSIS — M25611 Stiffness of right shoulder, not elsewhere classified: Secondary | ICD-10-CM | POA: Diagnosis not present

## 2017-04-28 DIAGNOSIS — M25612 Stiffness of left shoulder, not elsewhere classified: Secondary | ICD-10-CM | POA: Diagnosis not present

## 2017-04-29 DIAGNOSIS — M542 Cervicalgia: Secondary | ICD-10-CM | POA: Diagnosis not present

## 2017-04-29 DIAGNOSIS — M5010 Cervical disc disorder with radiculopathy, unspecified cervical region: Secondary | ICD-10-CM | POA: Diagnosis not present

## 2017-05-01 DIAGNOSIS — M47892 Other spondylosis, cervical region: Secondary | ICD-10-CM | POA: Diagnosis not present

## 2017-06-02 DIAGNOSIS — G43709 Chronic migraine without aura, not intractable, without status migrainosus: Secondary | ICD-10-CM | POA: Diagnosis not present

## 2017-06-02 DIAGNOSIS — M542 Cervicalgia: Secondary | ICD-10-CM | POA: Diagnosis not present

## 2017-06-09 DIAGNOSIS — K080 Exfoliation of teeth due to systemic causes: Secondary | ICD-10-CM | POA: Diagnosis not present

## 2017-06-23 DIAGNOSIS — K080 Exfoliation of teeth due to systemic causes: Secondary | ICD-10-CM | POA: Diagnosis not present

## 2017-08-06 ENCOUNTER — Ambulatory Visit: Payer: Federal, State, Local not specified - PPO | Admitting: Family Medicine

## 2017-08-06 ENCOUNTER — Encounter: Payer: Self-pay | Admitting: Family Medicine

## 2017-08-06 VITALS — BP 124/62 | HR 75 | Temp 98.2°F | Ht 64.75 in | Wt 137.2 lb

## 2017-08-06 DIAGNOSIS — M25552 Pain in left hip: Secondary | ICD-10-CM

## 2017-08-06 NOTE — Progress Notes (Signed)
Subjective:    Patient ID: [name], female    DOB: [birth year], 47 y.o.   MRN: [id number]  HPI Here for hip and groin pain on L   Wt Readings from Last 3 Encounters:  08/06/17 137 lb 4 oz (62.3 kg)  12/04/16 145 lb 4 oz (65.9 kg)  04/22/16 152 lb 4 oz (69.1 kg)   23.02 kg/m   Was seen in 2018 for pain in L groin and hip  xr reassuring  Still having issues  Feels like she has a pinched nerve in L groin  Now pain is radiating to knee and knee is getting sore   It will occ catch with walking or pivoting (stepping a certain way) - then has to limp and adjust gait   Points to inner L thigh below groin    Last hip xr and spine xr DG Lumbar Spine 2-3 Views (Accession [id number]) (Order [id number])  Imaging  Date: 05/10/2015 Department: [facility] Released By: Birder Robson, RN (auto-released) Authorizing: Ward, Delice Bison, DO  Exam Information   Status Exam Begun  Exam Ended   Final [99] 05/10/2015 1:22 AM 05/10/2015 1:32 AM  PACS Images   Show images for DG Lumbar Spine 2-3 Views  Study Result   CLINICAL DATA:  RIGHT buttock/RIGHT hip pain after lifting injury.  EXAM: LUMBAR SPINE - 2-3 VIEW  COMPARISON:  CT abdomen and pelvis Aug 28, 2011  FINDINGS: There is no evidence of lumbar spine fracture. Alignment is normal. Intervertebral disc spaces are maintained. Punctate calcification projects in RIGHT upper quadrant could represent nephrolithiasis or, may be external to the patient.  IMPRESSION: Negative.   Electronically Signed   By: Elon Alas M.D.   On: 05/10/2015 01:57    DG HIP UNILAT WITH PELVIS 2-3 VIEWS LEFT (Accession [id number]) (Order [id number])  Imaging  Date: 04/22/2016 Department: [facility] Released By: Daralene Milch C Authorizing: Zeyna Mkrtchyan, Wynelle Fanny, MD  Exam Information   Status Exam Begun  Exam Ended   Final [99] 04/22/2016 1:35 PM 04/22/2016 1:37 PM  PACS  Images   Show images for DG HIP UNILAT WITH PELVIS 2-3 VIEWS LEFT  Study Result   CLINICAL DATA:  Intermittent left hip and groin pain for several months. No known injury.  EXAM: DG HIP (WITH OR WITHOUT PELVIS) 2-3V LEFT  COMPARISON:  None.  FINDINGS: There is no evidence of hip fracture or dislocation. There is no evidence of arthropathy or other focal bone abnormality.  IMPRESSION: Negative exam.   Electronically Signed   By: Inge Rise M.D.   On: 04/22/2016 15:28   Has to stay away from nsaids and tylenol to prevent rebound headaches    Patient Active Problem List   Diagnosis Date Noted  . Hip pain 04/22/2016  . Cervical pain (neck) 11/08/2015  . Routine general medical examination at a health care facility 05/13/2015  . Pre-adoption visit for adoptive parent(s) 05/09/2014  . 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 Migraine without aura 01/13/2007   Past Medical History:  Diagnosis Date  . Kidney stones   . Migraine    chronic, daily  . Temporomandibular joint disorders, unspecified    Past Surgical History:  Procedure Laterality Date  . ABDOMINAL HYSTERECTOMY    . KNEE CARTILAGE SURGERY  1988   torn cartilage  . NECK SURGERY    . PARTIAL HYSTERECTOMY  1/07   Endometriosis; ovarian cyst (1/2 ovary on left)  .  TUBAL LIGATION     Social History   Tobacco Use  . Smoking status: Never Smoker  . Smokeless tobacco: Never Used  Substance Use Topics  . Alcohol use: No    Alcohol/week: 0.0 oz  . Drug use: No   Family History  Problem Relation Age of Onset  . Diabetes Father   . Color blindness Father   . Hypertension Mother   . Diabetes Unknown        GM  . Ovarian cancer Paternal Grandmother   . Pancreatitis Paternal Grandfather   . Cataracts Paternal Grandmother   . Cataracts Unknown        Aunt  . Cataracts Son        childhood  . Hypertension Sister   . Melanoma Unknown        grandparent  . Migraines Unknown         uncle   Allergies  Allergen Reactions  . Morphine Sulfate Itching    REACTION: unspecified   Current Outpatient Medications on File Prior to Visit  Medication Sig Dispense Refill  . Chlorzoxazone 750 MG TABS Take 1 tablet by mouth.     Eduard Roux 70 MG/ML SOAJ Inject 1 Device into the skin every 14 (fourteen) days.     . Multiple Vitamins-Minerals (MULTIVITAMIN WITH MINERALS) tablet Take 1 tablet by mouth daily.    . ondansetron (ZOFRAN) 8 MG tablet Take by mouth daily as needed.     . rizatriptan (MAXALT) 10 MG tablet Take 1 tablet by mouth daily as needed.    . topiramate (TOPAMAX) 100 MG tablet Take 200 mg by mouth daily as needed.      No current facility-administered medications on file prior to visit.     Review of Systems  Constitutional: Negative for activity change, appetite change, fatigue, fever and unexpected weight change.  HENT: Negative for congestion, ear pain, rhinorrhea, sinus pressure and sore throat.   Eyes: Negative for pain, redness and visual disturbance.  Respiratory: Negative for cough, shortness of breath and wheezing.   Cardiovascular: Negative for chest pain and palpitations.  Gastrointestinal: Negative for abdominal pain, blood in stool, constipation and diarrhea.  Endocrine: Negative for polydipsia and polyuria.  Genitourinary: Negative for dysuria, frequency and urgency.  Musculoskeletal: Negative for arthralgias, back pain and myalgias.       Pain in L groin/hip area  Skin: Negative for pallor and rash.  Allergic/Immunologic: Negative for environmental allergies.  Neurological: Negative for dizziness, syncope and headaches.  Hematological: Negative for adenopathy. Does not bruise/bleed easily.  Psychiatric/Behavioral: Negative for decreased concentration and dysphoric mood. The patient is not nervous/anxious.        Objective:   Physical Exam  Constitutional: She appears well-developed and well-nourished. No distress.  Well appearing     HENT:  Head: Normocephalic and atraumatic.  Eyes: Pupils are equal, round, and reactive to light. Conjunctivae and EOM are normal. No scleral icterus.  Neck: Normal range of motion. Neck supple.  Cardiovascular: Normal rate and regular rhythm.  Pulmonary/Chest: Effort normal and breath sounds normal. She has no wheezes. She has no rales.  Abdominal: Soft. Bowel sounds are normal. She exhibits no distension. There is no tenderness.  Musculoskeletal: She exhibits no edema, tenderness or deformity.       Left hip: She exhibits normal range of motion, normal strength, no tenderness, no bony tenderness, no crepitus and no deformity.       Lumbar back: She exhibits decreased range of motion and spasm.  She exhibits no bony tenderness and no edema.  Some discomfort on full int/ext rot of hip  No trochanteric tenderness Nl slr   Lymphadenopathy:    She has no cervical adenopathy.  Neurological: She is alert. She has normal strength and normal reflexes. She displays no atrophy. No cranial nerve deficit or sensory deficit. She exhibits normal muscle tone. Coordination normal.  Negative SLR  Skin: Skin is warm and dry. No rash noted. No erythema. No pallor.  Psychiatric: She has a normal mood and affect.          Assessment & Plan:   Problem List Items Addressed This Visit      Other   Hip pain - Primary    Nl hip and spine xrays in the past  Worse with activity  Encouraged cold compress/ stretch if able Ref to orthopedics       Relevant Orders   Ambulatory referral to Orthopedic Surgery

## 2017-08-06 NOTE — Patient Instructions (Addendum)
Try ice (cold compress) -for  10 minutes when you can  Avoid positions that hurt   We will refer to orthopedics

## 2017-08-10 NOTE — Assessment & Plan Note (Signed)
Nl hip and spine xrays in the past  Worse with activity  Encouraged cold compress/ stretch if able Ref to orthopedics

## 2017-08-18 DIAGNOSIS — M25552 Pain in left hip: Secondary | ICD-10-CM | POA: Diagnosis not present

## 2017-08-18 DIAGNOSIS — Z01419 Encounter for gynecological examination (general) (routine) without abnormal findings: Secondary | ICD-10-CM | POA: Diagnosis not present

## 2017-08-18 DIAGNOSIS — Z1231 Encounter for screening mammogram for malignant neoplasm of breast: Secondary | ICD-10-CM | POA: Diagnosis not present

## 2017-09-11 ENCOUNTER — Telehealth: Payer: Self-pay | Admitting: Family Medicine

## 2017-09-11 NOTE — Telephone Encounter (Signed)
In your inbox.

## 2017-09-11 NOTE — Telephone Encounter (Signed)
Patient dropped off Medical Evaluation Form for Social Services to renew [facility].  It expires in July. Patient said she already had her physical for this year.  When form is complete, please call patient to pick up.  Form is in rx tower.

## 2017-09-12 NOTE — Telephone Encounter (Signed)
Done and in IN box

## 2017-09-12 NOTE — Telephone Encounter (Signed)
Pt notified form ready for pick up

## 2017-09-15 DIAGNOSIS — M25552 Pain in left hip: Secondary | ICD-10-CM | POA: Diagnosis not present

## 2017-09-24 DIAGNOSIS — M25552 Pain in left hip: Secondary | ICD-10-CM | POA: Diagnosis not present

## 2017-09-29 DIAGNOSIS — Q6589 Other specified congenital deformities of hip: Secondary | ICD-10-CM | POA: Diagnosis not present

## 2017-09-29 DIAGNOSIS — G43709 Chronic migraine without aura, not intractable, without status migrainosus: Secondary | ICD-10-CM | POA: Diagnosis not present

## 2017-11-24 DIAGNOSIS — M25552 Pain in left hip: Secondary | ICD-10-CM | POA: Diagnosis not present

## 2017-11-30 ENCOUNTER — Telehealth: Payer: Self-pay | Admitting: Family Medicine

## 2017-11-30 DIAGNOSIS — Z0000 Encounter for general adult medical examination without abnormal findings: Secondary | ICD-10-CM

## 2017-11-30 NOTE — Telephone Encounter (Signed)
-----   Message from Ellamae Sia sent at 11/25/2017 10:45 AM EDT ----- Regarding: Lab orders for Monday, 8.26.19 Patient is scheduled for CPX labs, please order future labs, Thanks , Karna Christmas

## 2017-12-01 ENCOUNTER — Other Ambulatory Visit (INDEPENDENT_AMBULATORY_CARE_PROVIDER_SITE_OTHER): Payer: Federal, State, Local not specified - PPO

## 2017-12-01 DIAGNOSIS — Z0000 Encounter for general adult medical examination without abnormal findings: Secondary | ICD-10-CM

## 2017-12-01 LAB — LIPID PANEL
Cholesterol: 158 mg/dL (ref 0–200)
HDL: 54.2 mg/dL (ref >39.00)
LDL Cholesterol: 90 mg/dL (ref 0–99)
NonHDL: 104.07
Total CHOL/HDL Ratio: 3
Triglycerides: 70 mg/dL (ref 0.0–149.0)
VLDL: 14 mg/dL (ref 0.0–40.0)

## 2017-12-01 LAB — COMPREHENSIVE METABOLIC PANEL
ALT: 20 U/L (ref 0–35)
AST: 22 U/L (ref 0–37)
Albumin: 4.5 g/dL (ref 3.5–5.2)
Alkaline Phosphatase: 38 U/L — ABNORMAL LOW (ref 39–117)
BUN: 17 mg/dL (ref 6–23)
CO2: 25 mEq/L (ref 19–32)
Calcium: 9.4 mg/dL (ref 8.4–10.5)
Chloride: 108 mEq/L (ref 96–112)
Creatinine, Ser: 1.03 mg/dL (ref 0.40–1.20)
GFR: 61.17 mL/min (ref >60.00)
Glucose, Bld: 83 mg/dL (ref 70–99)
Potassium: 4.1 mEq/L (ref 3.5–5.1)
Sodium: 139 mEq/L (ref 135–145)
Total Bilirubin: 0.5 mg/dL (ref 0.2–1.2)
Total Protein: 7.3 g/dL (ref 6.0–8.3)

## 2017-12-01 LAB — CBC WITH DIFFERENTIAL/PLATELET
Basophils Absolute: 0 10*3/uL (ref 0.0–0.1)
Basophils Relative: 0.5 % (ref 0.0–3.0)
Eosinophils Absolute: 0 10*3/uL (ref 0.0–0.7)
Eosinophils Relative: 0.7 % (ref 0.0–5.0)
HCT: 40.3 % (ref 36.0–46.0)
Hemoglobin: 13.7 g/dL (ref 12.0–15.0)
Lymphocytes Relative: 22.4 % (ref 12.0–46.0)
Lymphs Abs: 1 10*3/uL (ref 0.7–4.0)
MCHC: 33.9 g/dL (ref 30.0–36.0)
MCV: 93.7 fl (ref 78.0–100.0)
Monocytes Absolute: 0.4 10*3/uL (ref 0.1–1.0)
Monocytes Relative: 9.3 % (ref 3.0–12.0)
Neutro Abs: 3.1 10*3/uL (ref 1.4–7.7)
Neutrophils Relative %: 67.1 % (ref 43.0–77.0)
Platelets: 198 10*3/uL (ref 150.0–400.0)
RBC: 4.3 Mil/uL (ref 3.87–5.11)
RDW: 12.5 % (ref 11.5–15.5)
WBC: 4.6 10*3/uL (ref 4.0–10.5)

## 2017-12-01 LAB — TSH: TSH: 0.58 u[IU]/mL (ref 0.35–4.50)

## 2017-12-15 ENCOUNTER — Encounter: Payer: Self-pay | Admitting: *Deleted

## 2017-12-15 ENCOUNTER — Encounter: Payer: Self-pay | Admitting: Family Medicine

## 2017-12-15 ENCOUNTER — Ambulatory Visit (INDEPENDENT_AMBULATORY_CARE_PROVIDER_SITE_OTHER): Payer: Federal, State, Local not specified - PPO | Admitting: Family Medicine

## 2017-12-15 VITALS — BP 106/74 | HR 66 | Temp 98.0°F | Ht 65.25 in | Wt 129.5 lb

## 2017-12-15 DIAGNOSIS — Z0000 Encounter for general adult medical examination without abnormal findings: Secondary | ICD-10-CM | POA: Diagnosis not present

## 2017-12-15 DIAGNOSIS — M21852 Other specified acquired deformities of left thigh: Secondary | ICD-10-CM | POA: Diagnosis not present

## 2017-12-15 NOTE — Assessment & Plan Note (Signed)
Reviewed health habits including diet and exercise and skin cancer prevention Reviewed appropriate screening tests for age  Also reviewed health mt list, fam hx and immunization status , as well as social and family history   See HPI Labs reviewed  Declines flu shot-enc her to consider it and call if she changes her mind Labs look good including cholesterol

## 2017-12-15 NOTE — Patient Instructions (Signed)
Labs look very good  Make sure to eat regular meals  Also keep up with fluids   Take care of yourself- stay as active as you can be   Use sunscreen when outdoors

## 2017-12-15 NOTE — Progress Notes (Signed)
Subjective:    Patient ID: [name], female    DOB: 03/30/1971, 47 y.o.   MRN: [id number]  HPI Here for health maintenance exam and to review chronic medical problems   Wt Readings from Last 3 Encounters:  12/15/17 129 lb 8 oz (58.7 kg)  08/06/17 137 lb 4 oz (62.3 kg)  12/04/16 145 lb 4 oz (65.9 kg)  wt is down- she is eating (her medication affects appetite and weight- topamax)  She carries food and water with her all the time Active job  21.39 kg/m   Doing well  Working a lot  Getting paid overtime    Mammogram 3/19 - at gyn as well as a pap Self breast exam -no breast lumps   Flu shot - does not take them   Pap 3/19 neg at gyn -we will send for report  Had partial hysterectomy for endometriosis   Tetanus shot 2/17  Headaches/migraine- doing ok  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   Also diagnosed with hip dysplasia - will eventually need a hip replacement  Aggravates her  Used topical nsaid for now  Cannot walk as fast   Cholesterol Lab Results  Component Value Date   CHOL 158 12/01/2017   CHOL 181 12/04/2016   CHOL 148 05/20/2016   Lab Results  Component Value Date   HDL 54.20 12/01/2017   HDL 52.70 12/04/2016   HDL 42.40 05/20/2016   Lab Results  Component Value Date   LDLCALC 90 12/01/2017   LDLCALC 102 (H) 12/04/2016   [place] 90 05/20/2016   Lab Results  Component Value Date   TRIG 70.0 12/01/2017   TRIG 130.0 12/04/2016   TRIG 79.0 05/20/2016   Lab Results  Component Value Date   CHOLHDL 3 12/01/2017   CHOLHDL 3 12/04/2016   CHOLHDL 3 05/20/2016   No results found for: LDLDIRECT Good cholesterol profile  Other labs: Results for orders placed or performed in visit on 12/01/17  TSH  Result Value Ref Range   TSH 0.58 0.35 - 4.50 uIU/mL  Lipid panel  Result Value Ref Range   Cholesterol 158 0 - 200 mg/dL   Triglycerides 70.0 0.0 - 149.0 mg/dL   HDL 54.20 >39.00 mg/dL   VLDL 14.0 0.0 - 40.0 mg/dL   LDL Cholesterol 90 0 - 99  mg/dL   Total CHOL/HDL Ratio 3    NonHDL 104.07   Comprehensive metabolic panel  Result Value Ref Range   Sodium 139 135 - 145 mEq/L   Potassium 4.1 3.5 - 5.1 mEq/L   Chloride 108 96 - 112 mEq/L   CO2 25 19 - 32 mEq/L   Glucose, Bld 83 70 - 99 mg/dL   BUN 17 6 - 23 mg/dL   Creatinine, Ser 1.03 0.40 - 1.20 mg/dL   Total Bilirubin 0.5 0.2 - 1.2 mg/dL   Alkaline Phosphatase 38 (L) 39 - 117 U/L   AST 22 0 - 37 U/L   ALT 20 0 - 35 U/L   Total Protein 7.3 6.0 - 8.3 g/dL   Albumin 4.5 3.5 - 5.2 g/dL   Calcium 9.4 8.4 - 10.5 mg/dL   GFR 61.17 >60.00 mL/min  CBC with Differential/Platelet  Result Value Ref Range   WBC 4.6 4.0 - 10.5 K/uL   RBC 4.30 3.87 - 5.11 Mil/uL   Hemoglobin 13.7 12.0 - 15.0 g/dL   HCT 40.3 36.0 - 46.0 %   MCV 93.7 78.0 - 100.0 fl  MCHC 33.9 30.0 - 36.0 g/dL   RDW 12.5 11.5 - 15.5 %   Platelets 198.0 150.0 - 400.0 K/uL   Neutrophils Relative % 67.1 43.0 - 77.0 %   Lymphocytes Relative 22.4 12.0 - 46.0 %   Monocytes Relative 9.3 3.0 - 12.0 %   Eosinophils Relative 0.7 0.0 - 5.0 %   Basophils Relative 0.5 0.0 - 3.0 %   Neutro Abs 3.1 1.4 - 7.7 K/uL   Lymphs Abs 1.0 0.7 - 4.0 K/uL   Monocytes Absolute 0.4 0.1 - 1.0 K/uL   Eosinophils Absolute 0.0 0.0 - 0.7 K/uL   Basophils Absolute 0.0 0.0 - 0.1 K/uL      Patient Active Problem List   Diagnosis Date Noted  . Hip dysplasia, acquired, left 12/15/2017  . Hip pain 04/22/2016  . Cervical pain (neck) 11/08/2015  . Routine general medical examination at a health care facility 05/13/2015  . Pre-adoption visit for adoptive parent(s) 05/09/2014  . 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 Migraine without aura 01/13/2007   Past Medical History:  Diagnosis Date  . Kidney stones   . Migraine    chronic, daily  . Temporomandibular joint disorders, unspecified    Past Surgical History:  Procedure Laterality Date  . ABDOMINAL HYSTERECTOMY    . KNEE CARTILAGE SURGERY  1988   torn cartilage  . NECK  SURGERY    . PARTIAL HYSTERECTOMY  1/07   Endometriosis; ovarian cyst (1/2 ovary on left)  . TUBAL LIGATION     Social History   Tobacco Use  . Smoking status: Never Smoker  . Smokeless tobacco: Never Used  Substance Use Topics  . Alcohol use: No    Alcohol/week: 0.0 standard drinks  . Drug use: No   Family History  Problem Relation Age of Onset  . Diabetes Father   . Color blindness Father   . Hypertension Mother   . Diabetes Unknown        GM  . Ovarian cancer Paternal Grandmother   . Pancreatitis Paternal Grandfather   . Cataracts Paternal Grandmother   . Cataracts Unknown        Aunt  . Cataracts Son        childhood  . Hypertension Sister   . Melanoma Unknown        grandparent  . Migraines Unknown        uncle   Allergies  Allergen Reactions  . Morphine Sulfate Itching    REACTION: unspecified   Current Outpatient Medications on File Prior to Visit  Medication Sig Dispense Refill  . Chlorzoxazone 750 MG TABS Take 1 tablet by mouth.     . Multiple Vitamins-Minerals (MULTIVITAMIN WITH MINERALS) tablet Take 1 tablet by mouth daily.    . rizatriptan (MAXALT) 10 MG tablet Take 1 tablet by mouth daily as needed.    . topiramate (TOPAMAX) 100 MG tablet Take 150 mg by mouth 2 (two) times daily.     No current facility-administered medications on file prior to visit.     Review of Systems  Constitutional: Negative for activity change, appetite change, fatigue, fever and unexpected weight change.  HENT: Negative for congestion, ear pain, rhinorrhea, sinus pressure and sore throat.   Eyes: Negative for pain, redness and visual disturbance.  Respiratory: Negative for cough, shortness of breath and wheezing.   Cardiovascular: Negative for chest pain and palpitations.  Gastrointestinal: Negative for abdominal pain, blood in stool, constipation and diarrhea.  Endocrine: Negative for polydipsia and polyuria.  Genitourinary: Negative for dysuria, frequency and urgency.    Musculoskeletal: Negative for arthralgias, back pain and myalgias.       Hip pain   Skin: Negative for pallor and rash.  Allergic/Immunologic: Negative for environmental allergies.  Neurological: Negative for dizziness, syncope and headaches.  Hematological: Negative for adenopathy. Does not bruise/bleed easily.  Psychiatric/Behavioral: Negative for decreased concentration and dysphoric mood. The patient is not nervous/anxious.        Objective:   Physical Exam  Constitutional: She appears well-developed and well-nourished. No distress.  Well appearing   HENT:  Head: Normocephalic and atraumatic.  Right Ear: External ear normal.  Left Ear: External ear normal.  Mouth/Throat: Oropharynx is clear and moist.  Eyes: Pupils are equal, round, and reactive to light. Conjunctivae and EOM are normal. No scleral icterus.  Neck: Normal range of motion. Neck supple. No JVD present. Carotid bruit is not present. No thyromegaly present.  Cardiovascular: Normal rate, regular rhythm, normal heart sounds and intact distal pulses. Exam reveals no gallop.  Pulmonary/Chest: Effort normal and breath sounds normal. No respiratory distress. She has no wheezes. She exhibits no tenderness. No breast tenderness, discharge or bleeding.  Abdominal: Soft. Bowel sounds are normal. She exhibits no distension, no abdominal bruit and no mass. There is no tenderness.  Genitourinary: No breast tenderness, discharge or bleeding.  Genitourinary Comments: Breast exam: No mass, nodules, thickening, tenderness, bulging, retraction, inflamation, nipple discharge or skin changes noted.  No axillary or clavicular LA.      Musculoskeletal: Normal range of motion. She exhibits no edema or tenderness.  Limited rom L hip  Gait is un affected   Lymphadenopathy:    She has no cervical adenopathy.  Neurological: She is alert. She has normal reflexes. She displays normal reflexes. No cranial nerve deficit. She exhibits normal muscle  tone. Coordination normal.  Skin: Skin is warm and dry. No rash noted. No erythema. No pallor.  Solar lentigines diffusely   Psychiatric: She has a normal mood and affect.          Assessment & Plan:   Problem List Items Addressed This Visit      Musculoskeletal and Integument   Hip dysplasia, acquired, left    Seeing Dr Mardelle Matte She will need hip replacement in the future         Other   Routine general medical examination at a health care facility - Primary    Reviewed health habits including diet and exercise and skin cancer prevention Reviewed appropriate screening tests for age  Also reviewed health mt list, fam hx and immunization status , as well as social and family history   See HPI Labs reviewed  Declines flu shot-enc her to consider it and call if she changes her mind Labs look good including cholesterol

## 2017-12-15 NOTE — Assessment & Plan Note (Signed)
Seeing Dr Mardelle Matte She will need hip replacement in the future

## 2017-12-22 DIAGNOSIS — K080 Exfoliation of teeth due to systemic causes: Secondary | ICD-10-CM | POA: Diagnosis not present

## 2017-12-29 DIAGNOSIS — G43709 Chronic migraine without aura, not intractable, without status migrainosus: Secondary | ICD-10-CM | POA: Diagnosis not present

## 2018-01-19 DIAGNOSIS — K080 Exfoliation of teeth due to systemic causes: Secondary | ICD-10-CM | POA: Diagnosis not present

## 2018-01-21 DIAGNOSIS — M47892 Other spondylosis, cervical region: Secondary | ICD-10-CM | POA: Diagnosis not present

## 2018-02-23 DIAGNOSIS — Q6589 Other specified congenital deformities of hip: Secondary | ICD-10-CM | POA: Diagnosis not present

## 2018-03-09 DIAGNOSIS — M25552 Pain in left hip: Secondary | ICD-10-CM | POA: Diagnosis not present

## 2018-05-27 DIAGNOSIS — Q6589 Other specified congenital deformities of hip: Secondary | ICD-10-CM | POA: Diagnosis not present

## 2018-05-27 DIAGNOSIS — M25552 Pain in left hip: Secondary | ICD-10-CM | POA: Diagnosis not present

## 2018-06-11 DIAGNOSIS — M25552 Pain in left hip: Secondary | ICD-10-CM | POA: Diagnosis not present

## 2018-09-02 DIAGNOSIS — K080 Exfoliation of teeth due to systemic causes: Secondary | ICD-10-CM | POA: Diagnosis not present

## 2018-09-03 DIAGNOSIS — Z6822 Body mass index (BMI) 22.0-22.9, adult: Secondary | ICD-10-CM | POA: Diagnosis not present

## 2018-09-03 DIAGNOSIS — Z1231 Encounter for screening mammogram for malignant neoplasm of breast: Secondary | ICD-10-CM | POA: Diagnosis not present

## 2018-09-03 DIAGNOSIS — Z01419 Encounter for gynecological examination (general) (routine) without abnormal findings: Secondary | ICD-10-CM | POA: Diagnosis not present

## 2018-11-04 DIAGNOSIS — M21852 Other specified acquired deformities of left thigh: Secondary | ICD-10-CM | POA: Diagnosis not present

## 2018-11-19 DIAGNOSIS — M25552 Pain in left hip: Secondary | ICD-10-CM | POA: Diagnosis not present

## 2018-12-07 ENCOUNTER — Encounter: Payer: Self-pay | Admitting: Family Medicine

## 2019-03-24 DIAGNOSIS — M25552 Pain in left hip: Secondary | ICD-10-CM | POA: Diagnosis not present

## 2019-04-05 DIAGNOSIS — Z79899 Other long term (current) drug therapy: Secondary | ICD-10-CM | POA: Diagnosis not present

## 2019-04-05 DIAGNOSIS — Z049 Encounter for examination and observation for unspecified reason: Secondary | ICD-10-CM | POA: Diagnosis not present

## 2019-04-05 DIAGNOSIS — G43719 Chronic migraine without aura, intractable, without status migrainosus: Secondary | ICD-10-CM | POA: Diagnosis not present

## 2019-04-05 DIAGNOSIS — R519 Headache, unspecified: Secondary | ICD-10-CM | POA: Diagnosis not present

## 2019-04-07 ENCOUNTER — Telehealth: Payer: Self-pay | Admitting: *Deleted

## 2019-04-07 DIAGNOSIS — M25552 Pain in left hip: Secondary | ICD-10-CM | POA: Diagnosis not present

## 2019-04-07 NOTE — Telephone Encounter (Signed)
Patient called stating that she went to [facility] and had lab work done. Patient stated that she was told that her liver counts were elevated and she should contact her PCP to have additional lab work done. Advised patient that she has not been seen in over a year and she would need to schedule an appointment to discuss this with Dr. Glori Bickers. Patient stated that Dr. Kirstie Mirza office is faxing over paperwork to Dr. Glori Bickers. Patient stated that she is going to pick her lab report up from Dr. Kirstie Mirza office and will bring with her for her appointment. Appointment scheduled for 04/12/19, patient unable to come this afternoon for an appointment because she already has another appointment scheduled.

## 2019-04-07 NOTE — Telephone Encounter (Signed)
Aware, I will look out for lab results and I will see her than

## 2019-04-12 ENCOUNTER — Other Ambulatory Visit: Payer: Self-pay

## 2019-04-12 ENCOUNTER — Encounter: Payer: Self-pay | Admitting: Family Medicine

## 2019-04-12 ENCOUNTER — Ambulatory Visit: Payer: Federal, State, Local not specified - PPO | Admitting: Family Medicine

## 2019-04-12 VITALS — BP 122/68 | HR 79 | Temp 97.3°F | Ht 65.25 in | Wt 155.0 lb

## 2019-04-12 DIAGNOSIS — R748 Abnormal levels of other serum enzymes: Secondary | ICD-10-CM | POA: Diagnosis not present

## 2019-04-12 DIAGNOSIS — R12 Heartburn: Secondary | ICD-10-CM | POA: Diagnosis not present

## 2019-04-12 DIAGNOSIS — R1084 Generalized abdominal pain: Secondary | ICD-10-CM | POA: Diagnosis not present

## 2019-04-12 DIAGNOSIS — R109 Unspecified abdominal pain: Secondary | ICD-10-CM | POA: Insufficient documentation

## 2019-04-12 NOTE — Assessment & Plan Note (Signed)
Along with wt gain/bloating/abd pain and inc LFTs Gave the ok to take pepcid otc if helpful  Pending abd [place] and labs incl cbc and lipase this time

## 2019-04-12 NOTE — Progress Notes (Signed)
Subjective:    Patient ID: [name], female    DOB: [birth year], 49 y.o.   MRN: [id number]  This visit occurred during the SARS-CoV-2 public health emergency.  Safety protocols were in place, including screening questions prior to the visit, additional usage of staff PPE, and extensive cleaning of exam room while observing appropriate contact time as indicated for disinfecting solutions.    HPI Pt presents with elevated liver tests [facility]) Was seen there for migraines They drew labs including LFT and sed rate   Will try trigger point injections    Wt Readings from Last 3 Encounters:  04/12/19 155 lb (70.3 kg)  12/15/17 129 lb 8 oz (58.7 kg)  08/06/17 137 lb 4 oz (62.3 kg)   25.60 kg/m   Has gained wt and feels bloated   Whole abdomen stays bloated and painful  Both sharp and dull pain -- feels very very full  At times pain in RUQ -sharp (? If relation to eating)  Put on over 20 lb in about a month - feels like it is in her abdomen    Started getting heartburn over the past 2 weeks  Does not matter if she eats or does not eat  No particular time -comes and goes  No regurgitation   More BM frequently  Nl BM -not loose or changed  No black stool    Sed rate nl at 4.0  AST 92 ALT 102  Amylase 88 (in normal range)  Bilirubin nl 0.60 GGT 36  TP 7.1 normal   Alcohol - drinks a glass of wine about once per month   No liver problems in the family she knows   Has ovarian cancer in the family- PGM Pancreatic cancer - PGF  MGF- died of unknown cancer type     She eats little meals/nibbles  Nothing has changed    Started getting hot flashes ? Clammy   Last visit to gyn- May (nl exam and also did cologuard which was negative)  Had pap and mammogram also-normal   Right now takes mvi and zonegran   Had a partial hysterectomy in 2007  Adenomyosis and endometriosis  Has 1 1/2 ovaries left after her surgery   Patient  Active Problem List   Diagnosis Date Noted  . Elevated liver enzymes 04/12/2019  . Abdominal pain 04/12/2019  . Heartburn 04/12/2019  . Hip dysplasia, acquired, left 12/15/2017  . Hip pain 04/22/2016  . Cervical pain (neck) 11/08/2015  . Routine general medical examination at a health care facility 05/13/2015  . Pre-adoption visit for adoptive parent(s) 05/09/2014  . 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 Migraine without aura 01/13/2007   Past Medical History:  Diagnosis Date  . Kidney stones   . Migraine    chronic, daily  . Temporomandibular joint disorders, unspecified    Past Surgical History:  Procedure Laterality Date  . ABDOMINAL HYSTERECTOMY    . KNEE CARTILAGE SURGERY  1988   torn cartilage  . NECK SURGERY    . PARTIAL HYSTERECTOMY  1/07   Endometriosis; ovarian cyst (1/2 ovary on left)  . TUBAL LIGATION     Social History   Tobacco Use  . Smoking status: Never Smoker  . Smokeless tobacco: Never Used  Substance Use Topics  . Alcohol use: No    Alcohol/week: 0.0 standard drinks  . Drug use: No   Family History  Problem Relation Age of Onset  . Diabetes Father   .  Color blindness Father   . Hypertension Mother   . Diabetes Unknown        GM  . Ovarian cancer Paternal Grandmother   . Pancreatitis Paternal Grandfather   . Cataracts Paternal Grandmother   . Cataracts Unknown        Aunt  . Cataracts Son        childhood  . Hypertension Sister   . Melanoma Unknown        grandparent  . Migraines Unknown        uncle   Allergies  Allergen Reactions  . Morphine Sulfate Itching    REACTION: unspecified   Current Outpatient Medications on File Prior to Visit  Medication Sig Dispense Refill  . Multiple Vitamins-Minerals (MULTIVITAMIN WITH MINERALS) tablet Take 1 tablet by mouth daily.    Marland Kitchen zonisamide (ZONEGRAN) 25 MG capsule Taper dose up to 100 mg daily     No current facility-administered medications on file prior to visit.     Review  of Systems  Constitutional: Positive for appetite change. Negative for activity change, fatigue, fever and unexpected weight change.  HENT: Negative for congestion, ear pain, rhinorrhea, sinus pressure and sore throat.   Eyes: Negative for pain, redness and visual disturbance.  Respiratory: Negative for cough, shortness of breath and wheezing.   Cardiovascular: Negative for chest pain and palpitations.  Gastrointestinal: Positive for abdominal distention, abdominal pain and nausea. Negative for anal bleeding, blood in stool, constipation, diarrhea, rectal pain and vomiting.  Endocrine: Negative for polydipsia and polyuria.  Genitourinary: Negative for dysuria, frequency and urgency.  Musculoskeletal: Negative for arthralgias, back pain and myalgias.  Skin: Negative for pallor and rash.  Allergic/Immunologic: Negative for environmental allergies.  Neurological: Positive for headaches. Negative for dizziness and syncope.  Hematological: Negative for adenopathy. Does not bruise/bleed easily.  Psychiatric/Behavioral: Negative for decreased concentration and dysphoric mood. The patient is not nervous/anxious.        Objective:   Physical Exam Constitutional:      General: She is not in acute distress.    Appearance: She is well-developed and normal weight. She is not ill-appearing or diaphoretic.  HENT:     Head: Normocephalic and atraumatic.     Mouth/Throat:     Mouth: Mucous membranes are moist.  Eyes:     General: No scleral icterus.    Conjunctiva/sclera: Conjunctivae normal.     Pupils: Pupils are equal, round, and reactive to light.  Cardiovascular:     Rate and Rhythm: Normal rate and regular rhythm.     Heart sounds: Normal heart sounds.  Pulmonary:     Effort: Pulmonary effort is normal. No respiratory distress.     Breath sounds: Normal breath sounds. No wheezing or rales.  Abdominal:     General: Bowel sounds are normal. There is no distension.     Palpations: Abdomen is  soft. There is no fluid wave, hepatomegaly, splenomegaly, mass or pulsatile mass.     Tenderness: There is generalized abdominal tenderness. There is no right CVA tenderness, left CVA tenderness, guarding or rebound. Positive signs include Murphy's sign. Negative signs include McBurney's sign.     Hernia: No hernia is present.     Comments: Abdomen is mildly bloated for pt but not overtly distended Widespread tenderness of abdomen -also with positive murphy's sign  No rebound or guarding No pain with movement or shaking the table    Musculoskeletal:     Cervical back: Normal range of motion and neck supple.  Right lower leg: No edema.     Left lower leg: No edema.  Lymphadenopathy:     Cervical: No cervical adenopathy.  Skin:    General: Skin is warm and dry.     Coloration: Skin is not jaundiced or pale.     Findings: No erythema or rash.  Neurological:     Mental Status: She is alert. Mental status is at baseline.  Psychiatric:        Mood and Affect: Mood normal.           Assessment & Plan:   Problem List Items Addressed This Visit      Other   Elevated liver enzymes - Primary    [facility]  AST of 92 and ALT of 102 with nl amylase of 88 Nl bilirubin and GGT and sed rate  No etoh or acetaminophen use   Now having symptoms of bloating/wt gain and abd pain along with early satiety  abd pain is widespread- it does incl RUQ pain that radiates up to shoulder   Re check LFTs today with renal panel and cbc and lipase and hepatitis screen  Stat ref for abd [place] as well  Gave the ok to use pepcid prn heartburn  If symptoms suddenly worsen- inst to go to ER        Relevant Orders   CBC w/Diff   Renal function panel   Hepatic function panel   Acute Hep Panel & Hep B Surface Ab   US Abdomen Complete   Lipase   Abdominal pain    With elevated LFT also heartburn and some nausea  [place] of abd ordered  Hepatitis screen and repeat LFTs today  Pend  results        Relevant Orders   CBC w/Diff   Renal function panel   Hepatic function panel   US Abdomen Complete   Lipase   Heartburn    Along with wt gain/bloating/abd pain and inc LFTs Gave the ok to take pepcid otc if helpful  Pending abd [place] and labs incl cbc and lipase this time

## 2019-04-12 NOTE — Patient Instructions (Addendum)
Try pepcid over the counter as needed   Labs today   I ordered an abdominal ultrasound  Our office will call you to schedule the ultrasound   Stick with a bland diet- avoid fatty foods

## 2019-04-12 NOTE — Assessment & Plan Note (Signed)
[facility]  AST of 92 and ALT of 102 with nl amylase of 88 Nl bilirubin and GGT and sed rate  No etoh or acetaminophen use   Now having symptoms of bloating/wt gain and abd pain along with early satiety  abd pain is widespread- it does incl RUQ pain that radiates up to shoulder   Re check LFTs today with renal panel and cbc and lipase and hepatitis screen  Stat ref for abd [place] as well  Gave the ok to use pepcid prn heartburn  If symptoms suddenly worsen- inst to go to ER

## 2019-04-12 NOTE — Assessment & Plan Note (Signed)
With elevated LFT also heartburn and some nausea  [place] of abd ordered  Hepatitis screen and repeat LFTs today  Pend results

## 2019-04-13 ENCOUNTER — Ambulatory Visit
Admission: RE | Admit: 2019-04-13 | Discharge: 2019-04-13 | Disposition: A | Discharge status: Home / Self Care | Payer: Federal, State, Local not specified - PPO | Source: Ambulatory Visit | Attending: Family Medicine | Admitting: Family Medicine

## 2019-04-13 ENCOUNTER — Telehealth: Payer: Self-pay | Admitting: Family Medicine

## 2019-04-13 DIAGNOSIS — R748 Abnormal levels of other serum enzymes: Secondary | ICD-10-CM

## 2019-04-13 DIAGNOSIS — R112 Nausea with vomiting, unspecified: Secondary | ICD-10-CM

## 2019-04-13 DIAGNOSIS — R1084 Generalized abdominal pain: Secondary | ICD-10-CM

## 2019-04-13 DIAGNOSIS — R140 Abdominal distension (gaseous): Secondary | ICD-10-CM | POA: Insufficient documentation

## 2019-04-13 DIAGNOSIS — R109 Unspecified abdominal pain: Secondary | ICD-10-CM | POA: Diagnosis not present

## 2019-04-13 LAB — CBC WITH DIFFERENTIAL/PLATELET
Basophils Absolute: 0 10*3/uL (ref 0.0–0.1)
Basophils Relative: 0.3 % (ref 0.0–3.0)
Eosinophils Absolute: 0.1 10*3/uL (ref 0.0–0.7)
Eosinophils Relative: 1.1 % (ref 0.0–5.0)
HCT: 39.2 % (ref 36.0–46.0)
Hemoglobin: 13.4 g/dL (ref 12.0–15.0)
Lymphocytes Relative: 20.1 % (ref 12.0–46.0)
Lymphs Abs: 1.5 10*3/uL (ref 0.7–4.0)
MCHC: 34.3 g/dL (ref 30.0–36.0)
MCV: 90.5 fl (ref 78.0–100.0)
Monocytes Absolute: 0.6 10*3/uL (ref 0.1–1.0)
Monocytes Relative: 8.4 % (ref 3.0–12.0)
Neutro Abs: 5.1 10*3/uL (ref 1.4–7.7)
Neutrophils Relative %: 70.1 % (ref 43.0–77.0)
Platelets: 249 10*3/uL (ref 150.0–400.0)
RBC: 4.33 Mil/uL (ref 3.87–5.11)
RDW: 12.3 % (ref 11.5–15.5)
WBC: 7.3 10*3/uL (ref 4.0–10.5)

## 2019-04-13 LAB — RENAL FUNCTION PANEL
Albumin: 4.5 g/dL (ref 3.5–5.2)
BUN: 14 mg/dL (ref 6–23)
CO2: 31 mEq/L (ref 19–32)
Calcium: 9.5 mg/dL (ref 8.4–10.5)
Chloride: 99 mEq/L (ref 96–112)
Creatinine, Ser: 0.91 mg/dL (ref 0.40–1.20)
GFR: 66.01 mL/min (ref >60.00)
Glucose, Bld: 89 mg/dL (ref 70–99)
Phosphorus: 4.2 mg/dL (ref 2.3–4.6)
Potassium: 3.8 mEq/L (ref 3.5–5.1)
Sodium: 137 mEq/L (ref 135–145)

## 2019-04-13 LAB — HEPATIC FUNCTION PANEL
ALT: 79 U/L — ABNORMAL HIGH (ref 0–35)
AST: 35 U/L (ref 0–37)
Albumin: 4.5 g/dL (ref 3.5–5.2)
Alkaline Phosphatase: 58 U/L (ref 39–117)
Bilirubin, Direct: 0.1 mg/dL (ref 0.0–0.3)
Total Bilirubin: 0.5 mg/dL (ref 0.2–1.2)
Total Protein: 7.3 g/dL (ref 6.0–8.3)

## 2019-04-13 LAB — LIPASE: Lipase: 43 U/L (ref 11.0–59.0)

## 2019-04-13 NOTE — Telephone Encounter (Signed)
-----   Message from Tammi Sou, [place] sent at 04/13/2019  4:53 PM EST ----- Pt notified of [place] results and Dr. Marliss Coots comments. Pt is still having abd pain so she does want to proceed with CT, I advise our [facility] will call and schedule that appt. Please put order in

## 2019-04-13 NOTE — Telephone Encounter (Signed)
Referral done Will route to [facility]

## 2019-04-14 LAB — ACUTE HEP PANEL AND HEP B SURFACE AB
HEPATITIS C ANTIBODY REFILL$(REFL): NONREACTIVE
Hep A IgM: NONREACTIVE
Hep B C IgM: NONREACTIVE
Hepatitis B Surface Ag: NONREACTIVE
SIGNAL TO CUT-OFF: 0.02 (ref <1.00)

## 2019-04-14 LAB — REFLEX TIQ

## 2019-04-14 NOTE — Telephone Encounter (Signed)
Appt made and patient aware.

## 2019-04-16 ENCOUNTER — Other Ambulatory Visit: Payer: Self-pay

## 2019-04-16 ENCOUNTER — Ambulatory Visit (INDEPENDENT_AMBULATORY_CARE_PROVIDER_SITE_OTHER)
Admission: RE | Admit: 2019-04-16 | Discharge: 2019-04-16 | Disposition: A | Discharge status: Home / Self Care | Payer: Federal, State, Local not specified - PPO | Source: Ambulatory Visit | Attending: Family Medicine | Admitting: Family Medicine

## 2019-04-16 DIAGNOSIS — R140 Abdominal distension (gaseous): Secondary | ICD-10-CM

## 2019-04-16 DIAGNOSIS — R112 Nausea with vomiting, unspecified: Secondary | ICD-10-CM

## 2019-04-16 DIAGNOSIS — R1110 Vomiting, unspecified: Secondary | ICD-10-CM | POA: Diagnosis not present

## 2019-04-16 MED ORDER — IOHEXOL 300 MG/ML  SOLN
100.0000 mL | Freq: Once | INTRAMUSCULAR | Status: AC | PRN
  Administered 2019-04-16: 15:00:00 100 mL via INTRAVENOUS

## 2019-04-17 ENCOUNTER — Telehealth: Payer: Self-pay | Admitting: Family Medicine

## 2019-04-17 DIAGNOSIS — R12 Heartburn: Secondary | ICD-10-CM

## 2019-04-17 DIAGNOSIS — R140 Abdominal distension (gaseous): Secondary | ICD-10-CM

## 2019-04-17 DIAGNOSIS — R1084 Generalized abdominal pain: Secondary | ICD-10-CM

## 2019-04-17 DIAGNOSIS — R748 Abnormal levels of other serum enzymes: Secondary | ICD-10-CM

## 2019-04-17 NOTE — Telephone Encounter (Signed)
-----   Message from [place] sent at 04/16/2019  4:40 PM EST ----- Patient advised of everything.  Patient states she wants to proceed with GI referral.  Patient states she had to drink 2 contrast bottles today for CT scan and after drinking this she felt like she ate 3 Big Macs, was very bloated like she was pregnant, had back pain, heartburn. She had 2 BMs this morning and did not eat.

## 2019-04-19 ENCOUNTER — Encounter: Payer: Self-pay | Admitting: Gastroenterology

## 2019-04-20 ENCOUNTER — Ambulatory Visit: Payer: Federal, State, Local not specified - PPO | Attending: Internal Medicine

## 2019-04-20 DIAGNOSIS — Z20822 Contact with and (suspected) exposure to covid-19: Secondary | ICD-10-CM

## 2019-04-22 LAB — NOVEL CORONAVIRUS, NAA: SARS-CoV-2, NAA: NOT DETECTED

## 2019-05-05 ENCOUNTER — Ambulatory Visit: Payer: Federal, State, Local not specified - PPO | Admitting: Gastroenterology

## 2019-05-05 ENCOUNTER — Other Ambulatory Visit (INDEPENDENT_AMBULATORY_CARE_PROVIDER_SITE_OTHER): Payer: Federal, State, Local not specified - PPO

## 2019-05-05 ENCOUNTER — Encounter: Payer: Self-pay | Admitting: Gastroenterology

## 2019-05-05 VITALS — BP 90/60 | HR 80 | Temp 98.5°F | Ht 66.0 in | Wt 153.0 lb

## 2019-05-05 DIAGNOSIS — R635 Abnormal weight gain: Secondary | ICD-10-CM | POA: Diagnosis not present

## 2019-05-05 DIAGNOSIS — R1013 Epigastric pain: Secondary | ICD-10-CM | POA: Diagnosis not present

## 2019-05-05 DIAGNOSIS — R1084 Generalized abdominal pain: Secondary | ICD-10-CM | POA: Diagnosis not present

## 2019-05-05 DIAGNOSIS — R7989 Other specified abnormal findings of blood chemistry: Secondary | ICD-10-CM

## 2019-05-05 DIAGNOSIS — R945 Abnormal results of liver function studies: Secondary | ICD-10-CM

## 2019-05-05 DIAGNOSIS — Z01818 Encounter for other preprocedural examination: Secondary | ICD-10-CM

## 2019-05-05 DIAGNOSIS — R112 Nausea with vomiting, unspecified: Secondary | ICD-10-CM | POA: Diagnosis not present

## 2019-05-05 LAB — HEPATIC FUNCTION PANEL
ALT: 26 U/L (ref 0–35)
AST: 31 U/L (ref 0–37)
Albumin: 4.5 g/dL (ref 3.5–5.2)
Alkaline Phosphatase: 50 U/L (ref 39–117)
Bilirubin, Direct: 0.1 mg/dL (ref 0.0–0.3)
Total Bilirubin: 0.6 mg/dL (ref 0.2–1.2)
Total Protein: 7.1 g/dL (ref 6.0–8.3)

## 2019-05-05 LAB — TSH: TSH: 0.89 u[IU]/mL (ref 0.35–4.50)

## 2019-05-05 MED ORDER — ONDANSETRON 4 MG PO TBDP: 4.0000 mg | ORAL_TABLET | Freq: Every day | ORAL | 0 refills | Status: DC

## 2019-05-05 NOTE — Progress Notes (Addendum)
[name]    [id number]    01-09-1971  Primary Care Physician:Tower, Wynelle Fanny, MD  Referring Physician: Tower, Wynelle Fanny, MD [place],  [place] [address]   Chief complaint:  Nausea and Vomiting, unintentional weight gain and abnormal LFT  HPI: 49 year old female here for new patient visit with complaints of intermittent nausea and vomiting and also for evaluation of abnormal LFT.  She has history of chronic migraine. She has been having epigastric abdominal pain and intermittent nausea and vomiting with poor oral intake.  But complains of significant weight gain ~25 pounds in the past 3 months. Her symptoms have been ongoing for the past 2 to 3 months.  Denies any melena, hematemesis or blood per rectum. No family history of GI malignancy.  She had mild elevation in AST and ALT on April 12, 2019 Denies alcohol use or recreational drug use  Hepatic Function Latest Ref Rng & Units 05/05/2019 04/12/2019 04/12/2019  Total Protein 6.0 - 8.3 g/dL 7.1 7.3 -  Albumin 3.5 - 5.2 g/dL 4.5 4.5 4.5  AST 0 - 37 U/L 31 35 -  ALT 0 - 35 U/L 26 79(H) -  Alk Phosphatase 39 - 117 U/L 50 58 -  Total Bilirubin 0.2 - 1.2 mg/dL 0.6 0.5 -  Bilirubin, Direct 0.0 - 0.3 mg/dL 0.1 0.1 -      CT abdomen and pelvis with contrast 04/16/19: No acute findings. Patulous colon filled with stool.   Ultrasound 04/13/19: 95m Hyperechoic right kidney angiomyolipoma Normal liver and gallbladder    Outpatient Encounter Medications as of 05/05/2019  Medication Sig  . Multiple Vitamins-Minerals (MULTIVITAMIN WITH MINERALS) tablet Take 1 tablet by mouth daily.  .Marland Kitchenzonisamide (ZONEGRAN) 25 MG capsule Taper dose up to 100 mg daily   No facility-administered encounter medications on file as of 05/05/2019.    Allergies as of 05/05/2019 - Review Complete 05/05/2019  Allergen Reaction Noted  . Morphine sulfate Itching 06/14/2006    Past Medical History:  Diagnosis Date  . Kidney  stones   . Migraine    chronic, daily  . Temporomandibular joint disorders, unspecified     Past Surgical History:  Procedure Laterality Date  . ABDOMINAL HYSTERECTOMY    . KNEE CARTILAGE SURGERY  1988   torn cartilage  . NECK SURGERY    . PARTIAL HYSTERECTOMY  1/07   Endometriosis; ovarian cyst (1/2 ovary on left)  . TUBAL LIGATION      Family History  Problem Relation Age of Onset  . Diabetes Father   . Color blindness Father   . Hypertension Mother   . Diabetes Other        GM  . Ovarian cancer Paternal Grandmother   . Cataracts Paternal Grandmother   . Pancreatitis Paternal Grandfather   . Cataracts Other        Aunt  . Cataracts Son        childhood  . Hypertension Sister   . Melanoma Other        grandparent  . Migraines Other        uncle    Social History   Socioeconomic History  . Marital status: Married    Spouse name: Not on file  . Number of children: 3  . Years of education: Not on file  . Highest education level: Not on file  Occupational History  . Occupation: [employer]  Tobacco Use  . Smoking status: Never Smoker  . Smokeless tobacco: Never Used  Substance and Sexual Activity  . Alcohol use: No    Alcohol/week: 0.0 standard drinks  . Drug use: No  . Sexual activity: Not on file  Other Topics Concern  . 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 Difficulty of Paying Living Expenses: Not on file  Food Insecurity:   . Worried About Running Out of Food in the Last Year: Not on file  . Ran Out of Food in the Last Year: Not on file  Transportation Needs:   . Lack of Transportation (Medical): Not on file  . Lack of Transportation (Non-Medical): Not on  file  Physical Activity:   . Days of Exercise per Week: Not on file  . Minutes of Exercise per Session: Not on file  Stress:   . Feeling of Stress : Not on file  Social Connections:   . Frequency of Communication with Friends and Family: Not on file  . Frequency of Social Gatherings with Friends and Family: Not on file  . Attends Religious Services: Not on file  . Active Member of Clubs or Organizations: Not on file  . Attends Club or Organization Meetings: Not on file  . Marital Status: Not on file  Intimate Partner Violence:   . Fear of Current or Ex-Partner: Not on file  . Emotionally Abused: Not on file  . Physically Abused: Not on file  . Sexually Abused: Not on file      Review of systems: All other review of systems negative except as mentioned in the HPI.  Physical Exam: Vitals:   05/05/19 1409  BP: 90/60  Pulse: 80  Temp: 98.5 F (36.9 C)   Body mass index is 24.69 kg/m. Gen:      No acute distress HEENT:  EOMI, sclera anicteric Neck:     No masses; no thyromegaly Lungs:    Clear to auscultation bilaterally; normal respiratory effort CV:         Regular rate and rhythm; no murmurs Abd:      + bowel sounds; soft, non-tender; no palpable masses, no distension Ext:    No edema; adequate peripheral perfusion Skin:      Warm and dry; no rash Neuro: alert and oriented x 3 Psych: normal mood and affect  Data Reviewed:  Reviewed labs, radiology imaging, old records and pertinent past GI work up   Assessment and Plan/Recommendations:  48-year-old female here with complaints of epigastric abdominal pain associated with intermittent nausea and vomiting.  Patient also complains of unintentional weight gain  Zofran 4 mg daily as needed for severe nausea and vomiting Small frequent meals  We will schedule for EGD to exclude gastroduodenal ulcer or gastric outlet obstruction.  Will also need to exclude erosive gastritis, H. pylori infection. No evidence of volume  overload on exam.  Unclear etiology for significant weight gain We will check TSH to exclude hypothyroidism ?  Eating disorder or body dysmorphic disorder  Due for colorectal cancer screening, will schedule for colonoscopy along with EGD The risks and benefits as well as alternatives of endoscopic procedure(s) have been   discussed and reviewed. All questions answered. The patient agrees to proceed.  Elevated AST and ALT: Subacute liver injury, most likely etiology fatty liver.  Avoid NSAIDs and EtOH Will recheck LFT   Follow-up after EGD and colonoscopy   The patient was provided an opportunity to ask questions and all were answered. The patient agreed with the plan and demonstrated an understanding of the instructions.  Damaris Hippo , MD    CC: Tower, Wynelle Fanny, MD

## 2019-05-05 NOTE — Patient Instructions (Addendum)
You have been scheduled for an endoscopy and colonoscopy. Please follow the written instructions given to you at your visit today. Please pick up your prep supplies at the pharmacy within the next 1-3 days. If you use inhalers (even only as needed), please bring them with you on the day of your procedure.   Go to the basement for labs today  We have sent Zofran to your pharmacy  If you are age 48 or older, your body mass index should be between 23-30. Your Body mass index is 24.69 kg/m. If this is out of the aforementioned range listed, please consider follow up with your Primary Care Provider.  If you are age 22 or younger, your body mass index should be between 19-25. Your Body mass index is 24.69 kg/m. If this is out of the aformentioned range listed, please consider follow up with your Primary Care Provider.   You have been scheduled for an endoscopy and colonoscopy. Please follow the written instructions given to you at your visit today. Please pick up your prep supplies at the pharmacy within the next 1-3 days. If you use inhalers (even only as needed), please bring them with you on the day of your procedure.   Due to recent COVID-19 restrictions implemented by [employer] and state authorities and in an effort to keep both patients and staff as safe as possible, [place] requires COVID-19 testing prior to any scheduled endoscopic procedure. The testing center is located at [address] [place], [place] [address] in the [facility] [employer]  suite.  Your appointment has been scheduled for Wednesday, 05-12-19 at 3:30pm.   Please bring your insurance cards to this appointment. You will require your COVID screen 2 business days prior to your endoscopic procedure.  You are not required to quarantine after your screening.  You will only receive a phone call with the results if it is POSITIVE.  If you do not receive a call the day  before your procedure you should begin your prep, if ordered, and you should report to the endo center for your procedure at your designated appointment arrival time ( one hour prior to the procedure time). There is no cost to you for the screening on the day of the swab.  [facility] will file with your insurance company for the testing.    You may receive an automated phone call prior to your procedure or have a message in your MyChart that you have an appointment for a BP/15 at the [facility], please disregard this message.  Your testing will be at the [place] [place] location.   If you are leaving [facility] travel [place] on [address], turn left onto [address], turn night onto [address] at the 1st stop light turn right, pass the [employer] on your right and proceed to [address] (white building).    We have sent the following medications to your pharmacy for you to pick up at your convenience:: Zofran 4m: daily as needed   I appreciate the  opportunity to care for you  Thank You   KHarl Bowie, MD

## 2019-05-11 ENCOUNTER — Encounter: Payer: Self-pay | Admitting: Gastroenterology

## 2019-05-12 ENCOUNTER — Ambulatory Visit (INDEPENDENT_AMBULATORY_CARE_PROVIDER_SITE_OTHER): Payer: Federal, State, Local not specified - PPO

## 2019-05-12 ENCOUNTER — Other Ambulatory Visit: Payer: Self-pay | Admitting: Gastroenterology

## 2019-05-12 ENCOUNTER — Other Ambulatory Visit: Payer: Self-pay

## 2019-05-12 DIAGNOSIS — Z1159 Encounter for screening for other viral diseases: Secondary | ICD-10-CM | POA: Diagnosis not present

## 2019-05-13 LAB — SARS CORONAVIRUS 2 (TAT 6-24 HRS): SARS Coronavirus 2: NEGATIVE

## 2019-05-14 ENCOUNTER — Encounter: Payer: Self-pay | Admitting: Gastroenterology

## 2019-05-14 ENCOUNTER — Other Ambulatory Visit: Payer: Self-pay

## 2019-05-14 ENCOUNTER — Ambulatory Visit (AMBULATORY_SURGERY_CENTER): Payer: Federal, State, Local not specified - PPO | Admitting: Gastroenterology

## 2019-05-14 VITALS — BP 94/55 | HR 69 | Temp 97.1°F | Resp 17 | Ht 66.0 in | Wt 153.0 lb

## 2019-05-14 DIAGNOSIS — R945 Abnormal results of liver function studies: Secondary | ICD-10-CM

## 2019-05-14 DIAGNOSIS — R1013 Epigastric pain: Secondary | ICD-10-CM

## 2019-05-14 DIAGNOSIS — K2951 Unspecified chronic gastritis with bleeding: Secondary | ICD-10-CM | POA: Diagnosis not present

## 2019-05-14 DIAGNOSIS — Z1211 Encounter for screening for malignant neoplasm of colon: Secondary | ICD-10-CM

## 2019-05-14 DIAGNOSIS — D125 Benign neoplasm of sigmoid colon: Secondary | ICD-10-CM

## 2019-05-14 DIAGNOSIS — K635 Polyp of colon: Secondary | ICD-10-CM | POA: Diagnosis not present

## 2019-05-14 DIAGNOSIS — R109 Unspecified abdominal pain: Secondary | ICD-10-CM | POA: Diagnosis not present

## 2019-05-14 DIAGNOSIS — K2950 Unspecified chronic gastritis without bleeding: Secondary | ICD-10-CM | POA: Diagnosis not present

## 2019-05-14 DIAGNOSIS — R7989 Other specified abnormal findings of blood chemistry: Secondary | ICD-10-CM

## 2019-05-14 DIAGNOSIS — R112 Nausea with vomiting, unspecified: Secondary | ICD-10-CM

## 2019-05-14 DIAGNOSIS — K2900 Acute gastritis without bleeding: Secondary | ICD-10-CM

## 2019-05-14 MED ORDER — SODIUM CHLORIDE 0.9 % IV SOLN: 500.0000 mL | Freq: Once | INTRAVENOUS | Status: DC

## 2019-05-14 NOTE — Patient Instructions (Signed)
Handouts given for gastritis, polyp and hemorrhoids.  Return to Dr. Woodward Ku office in 1 month, the office will call to set up.  YOU HAD AN ENDOSCOPIC PROCEDURE TODAY AT [facility]:   Refer to the procedure report that was given to you for any specific questions about what was found during the examination.  If the procedure report does not answer your questions, please call your gastroenterologist to clarify.  If you requested that your care partner not be given the details of your procedure findings, then the procedure report has been included in a sealed envelope for you to review at your convenience later.  YOU SHOULD EXPECT: Some feelings of bloating in the abdomen. Passage of more gas than usual.  Walking can help get rid of the air that was put into your GI tract during the procedure and reduce the bloating. If you had a lower endoscopy (such as a colonoscopy or flexible sigmoidoscopy) you may notice spotting of blood in your stool or on the toilet paper. If you underwent a bowel prep for your procedure, you may not have a normal bowel movement for a few days.  Please Note:  You might notice some irritation and congestion in your nose or some drainage.  This is from the oxygen used during your procedure.  There is no need for concern and it should clear up in a day or so.  SYMPTOMS TO REPORT IMMEDIATELY:   Following lower endoscopy (colonoscopy or flexible sigmoidoscopy):  Excessive amounts of blood in the stool  Significant tenderness or worsening of abdominal pains  Swelling of the abdomen that is new, acute  Fever of 100F or higher   Following upper endoscopy (EGD)  Vomiting of blood or coffee ground material  New chest pain or pain under the shoulder blades  Painful or persistently difficult swallowing  New shortness of breath  Fever of 100F or higher  Black, tarry-looking stools  For urgent or emergent issues, a gastroenterologist can be reached at any  hour by calling [phone].   DIET:  We do recommend a small meal at first, but then you may proceed to your regular diet.  Drink plenty of fluids but you should avoid alcoholic beverages for 24 hours.  ACTIVITY:  You should plan to take it easy for the rest of today and you should NOT DRIVE or use heavy machinery until tomorrow (because of the sedation medicines used during the test).    FOLLOW UP: Our staff will call the number listed on your records 48-72 hours following your procedure to check on you and address any questions or concerns that you may have regarding the information given to you following your procedure. If we do not reach you, we will leave a message.  We will attempt to reach you two times.  During this call, we will ask if you have developed any symptoms of COVID 19. If you develop any symptoms (ie: fever, flu-like symptoms, shortness of breath, cough etc.) before then, please call [phone].  If you test positive for Covid 19 in the 2 weeks post procedure, please call and report this information to [place].    If any biopsies were taken you will be contacted by phone or by letter within the next 1-3 weeks.  Please call us at [phone] if you have not heard about the biopsies in 3 weeks.    SIGNATURES/CONFIDENTIALITY: You and/or your care partner have signed paperwork which will be entered into your electronic  medical record.  These signatures attest to the fact that that the information above on your After Visit Summary has been reviewed and is understood.  Full responsibility of the confidentiality of this discharge information lies with you and/or your care-partner.

## 2019-05-14 NOTE — Progress Notes (Signed)
Temp by LC, vitals by DT

## 2019-05-14 NOTE — Op Note (Signed)
[place] Patient Name: [name] Procedure Date: 05/14/2019 3:20 PM MRN: [id number] Endoscopist: Mauri Pole , MD Age: 49 Referring MD:  Date of Birth: [birth year] Gender: Female Account #: [id number] Procedure:                Upper GI endoscopy Indications:              Persistent vomiting of unknown cause, Epigastric                            abdominal pain Medicines:                Monitored Anesthesia Care Procedure:                Pre-Anesthesia Assessment:                           - Prior to the procedure, a History and Physical                            was performed, and patient medications and                            allergies were reviewed. The patient's tolerance of                            previous anesthesia was also reviewed. The risks                            and benefits of the procedure and the sedation                            options and risks were discussed with the patient.                            All questions were answered, and informed consent                            was obtained. Prior Anticoagulants: The patient has                            taken no previous anticoagulant or antiplatelet                            agents. ASA Grade Assessment: II - A patient with                            mild systemic disease. After reviewing the risks                            and benefits, the patient was deemed in                            satisfactory condition to undergo the procedure.  After obtaining informed consent, the endoscope was                            passed under direct vision. Throughout the                            procedure, the patient's blood pressure, pulse, and                            oxygen saturations were monitored continuously. The                            Endoscope was introduced through the mouth, and                            advanced to the second part of duodenum.  The upper                            GI endoscopy was accomplished without difficulty.                            The patient tolerated the procedure well. Scope In: Scope Out: Findings:                 The esophagus was normal.                           Patchy minimal inflammation characterized by                            congestion (edema) and erythema was found in the                            entire examined stomach. Biopsies were taken with a                            cold forceps for Helicobacter pylori testing.                           The examined duodenum was normal. Complications:            No immediate complications. Estimated Blood Loss:     Estimated blood loss was minimal. Impression:               - Normal esophagus.                           - Gastritis. Biopsied.                           - Normal examined duodenum. Recommendation:           - Patient has a contact number available for                            emergencies. The signs and symptoms of potential  delayed complications were discussed with the                            patient. Return to normal activities tomorrow.                            Written discharge instructions were provided to the                            patient.                           - Return to GI office in 1 month, next available                            appt.                           - Await pathology results.                           - Resume previous diet.                           - Continue present medications.                           - See the other procedure note for documentation of                            additional recommendations. Mauri Pole, MD 05/14/2019 3:50:26 PM This report has been signed electronically.

## 2019-05-14 NOTE — Progress Notes (Signed)
Called to room to assist during endoscopic procedure.  Patient ID and intended procedure confirmed with present staff. Received instructions for my participation in the procedure from the performing physician.  

## 2019-05-14 NOTE — Progress Notes (Signed)
Pt tolerated well. VSS. Awake and to recovery.

## 2019-05-14 NOTE — Op Note (Signed)
[place] Patient Name: [name] Procedure Date: 05/14/2019 3:19 PM MRN: [id number] Endoscopist: Mauri Pole , MD Age: 49 Referring MD:  Date of Birth: 1970/05/03 Gender: Female Account #: [id number] Procedure:                Colonoscopy Indications:              Screening for colorectal malignant neoplasm Medicines:                Monitored Anesthesia Care Procedure:                Pre-Anesthesia Assessment:                           - Prior to the procedure, a History and Physical                            was performed, and patient medications and                            allergies were reviewed. The patient's tolerance of                            previous anesthesia was also reviewed. The risks                            and benefits of the procedure and the sedation                            options and risks were discussed with the patient.                            All questions were answered, and informed consent                            was obtained. Prior Anticoagulants: The patient has                            taken no previous anticoagulant or antiplatelet                            agents. ASA Grade Assessment: II - A patient with                            mild systemic disease. After reviewing the risks                            and benefits, the patient was deemed in                            satisfactory condition to undergo the procedure.                           After obtaining informed consent, the colonoscope  was passed under direct vision. Throughout the                            procedure, the patient's blood pressure, pulse, and                            oxygen saturations were monitored continuously. The                            Colonoscope was introduced through the anus and                            advanced to the the cecum, identified by                            appendiceal orifice and  ileocecal valve. The                            colonoscopy was performed without difficulty. The                            patient tolerated the procedure well. The quality                            of the bowel preparation was excellent. The                            ileocecal valve, appendiceal orifice, and rectum                            were photographed. Scope In: 3:28:33 PM Scope Out: 3:46:49 PM Scope Withdrawal Time: 0 hours 13 minutes 15 seconds  Total Procedure Duration: 0 hours 18 minutes 16 seconds  Findings:                 The perianal and digital rectal examinations were                            normal.                           A 5 mm polyp was found in the sigmoid colon. The                            polyp was sessile. The polyp was removed with a                            cold snare. Resection and retrieval were complete.                           Non-bleeding internal hemorrhoids were found during                            retroflexion. The hemorrhoids were small.  The exam was otherwise without abnormality. Complications:            No immediate complications. Estimated Blood Loss:     Estimated blood loss was minimal. Impression:               - One 5 mm polyp in the sigmoid colon, removed with                            a cold snare. Resected and retrieved.                           - Non-bleeding internal hemorrhoids.                           - The examination was otherwise normal. Recommendation:           - Patient has a contact number available for                            emergencies. The signs and symptoms of potential                            delayed complications were discussed with the                            patient. Return to normal activities tomorrow.                            Written discharge instructions were provided to the                            patient.                           - Resume previous  diet.                           - Continue present medications.                           - Await pathology results.                           - Repeat colonoscopy in 5-10 years for surveillance                            based on pathology results. Mauri Pole, MD 05/14/2019 3:52:04 PM This report has been signed electronically.

## 2019-05-18 ENCOUNTER — Telehealth: Payer: Self-pay | Admitting: *Deleted

## 2019-05-18 NOTE — Telephone Encounter (Signed)
  Follow up Call-  Call back number 05/14/2019  Post procedure Call Back phone  # [phone]  Permission to leave phone message Yes  Some recent data might be hidden     Patient questions:  Do you have a fever, pain , or abdominal swelling? No. Pain Score  0 *  Have you tolerated food without any problems? Yes.    Have you been able to return to your normal activities? Yes.    Do you have any questions about your discharge instructions: Diet   No. Medications  No. Follow up visit  No.  Do you have questions or concerns about your Care? No.  Actions: * If pain score is 4 or above: 1. No action needed, pain <4.Have you developed a fever since your procedure? no  2.   Have you had an respiratory symptoms (SOB or cough) since your procedure? no  3.   Have you tested positive for COVID 19 since your procedure no  4.   Have you had any family members/close contacts diagnosed with the COVID 19 since your procedure?  no   If yes to any of these questions please route to Joylene John, RN and Alphonsa Gin, Therapist, sports.

## 2019-05-20 ENCOUNTER — Encounter: Payer: Self-pay | Admitting: Gastroenterology

## 2019-05-26 ENCOUNTER — Telehealth: Payer: Self-pay

## 2019-05-26 NOTE — Telephone Encounter (Signed)
Called the patient to help her schedule a follow up appointment. See procedure note. No answer. Left a message to the nature of the call and asked she call back.

## 2019-06-23 DIAGNOSIS — M1612 Unilateral primary osteoarthritis, left hip: Secondary | ICD-10-CM | POA: Diagnosis not present

## 2019-08-09 DIAGNOSIS — Z0279 Encounter for issue of other medical certificate: Secondary | ICD-10-CM

## 2019-08-10 ENCOUNTER — Telehealth: Payer: Self-pay | Admitting: Family Medicine

## 2019-08-10 NOTE — Telephone Encounter (Signed)
I spoke to patient and notified her form is ready and $29 charge.  Patient asked me to put her form in the same envelope as her husband,James Gerner, and one of them will pick up the form.

## 2019-08-10 NOTE — Telephone Encounter (Signed)
I left a voice mail for patient to return my call.  Patient's DSS form is ready for pick up.  There is a $20 charge, but they will mail the bill to her. Form is at the front desk.

## 2019-09-21 ENCOUNTER — Emergency Department (HOSPITAL_COMMUNITY)
Admission: EM | Admit: 2019-09-21 | Discharge: 2019-09-22 | Disposition: A | Discharge status: Home / Self Care | Payer: Federal, State, Local not specified - PPO | Attending: Emergency Medicine | Admitting: Emergency Medicine

## 2019-09-21 ENCOUNTER — Encounter (HOSPITAL_COMMUNITY): Payer: Self-pay | Admitting: Emergency Medicine

## 2019-09-21 ENCOUNTER — Other Ambulatory Visit: Payer: Self-pay

## 2019-09-21 DIAGNOSIS — Z79899 Other long term (current) drug therapy: Secondary | ICD-10-CM | POA: Diagnosis not present

## 2019-09-21 DIAGNOSIS — N200 Calculus of kidney: Secondary | ICD-10-CM

## 2019-09-21 DIAGNOSIS — R109 Unspecified abdominal pain: Secondary | ICD-10-CM | POA: Diagnosis not present

## 2019-09-21 LAB — BASIC METABOLIC PANEL
Anion gap: 11 (ref 5–15)
BUN: 16 mg/dL (ref 6–20)
CO2: 26 mmol/L (ref 22–32)
Calcium: 9.6 mg/dL (ref 8.9–10.3)
Chloride: 102 mmol/L (ref 98–111)
Creatinine, Ser: 0.94 mg/dL (ref 0.44–1.00)
GFR calc Af Amer: >60 mL/min (ref >60)
GFR calc non Af Amer: >60 mL/min (ref >60)
Glucose, Bld: 116 mg/dL — ABNORMAL HIGH (ref 70–99)
Potassium: 3.5 mmol/L (ref 3.5–5.1)
Sodium: 139 mmol/L (ref 135–145)

## 2019-09-21 LAB — CBC WITH DIFFERENTIAL/PLATELET
Abs Immature Granulocytes: 0.01 10*3/uL (ref 0.00–0.07)
Basophils Absolute: 0 10*3/uL (ref 0.0–0.1)
Basophils Relative: 1 %
Eosinophils Absolute: 0.1 10*3/uL (ref 0.0–0.5)
Eosinophils Relative: 1 %
HCT: 41.4 % (ref 36.0–46.0)
Hemoglobin: 13.7 g/dL (ref 12.0–15.0)
Immature Granulocytes: 0 %
Lymphocytes Relative: 38 %
Lymphs Abs: 2.2 10*3/uL (ref 0.7–4.0)
MCH: 30.6 pg (ref 26.0–34.0)
MCHC: 33.1 g/dL (ref 30.0–36.0)
MCV: 92.4 fL (ref 80.0–100.0)
Monocytes Absolute: 0.6 10*3/uL (ref 0.1–1.0)
Monocytes Relative: 10 %
Neutro Abs: 2.9 10*3/uL (ref 1.7–7.7)
Neutrophils Relative %: 50 %
Platelets: 214 10*3/uL (ref 150–400)
RBC: 4.48 MIL/uL (ref 3.87–5.11)
RDW: 11.9 % (ref 11.5–15.5)
WBC: 5.7 10*3/uL (ref 4.0–10.5)
nRBC: 0 % (ref 0.0–0.2)

## 2019-09-21 LAB — URINALYSIS, ROUTINE W REFLEX MICROSCOPIC
Bilirubin Urine: NEGATIVE
Glucose, UA: NEGATIVE mg/dL
Hgb urine dipstick: NEGATIVE
Ketones, ur: NEGATIVE mg/dL
Leukocytes,Ua: NEGATIVE
Nitrite: NEGATIVE
Protein, ur: NEGATIVE mg/dL
Specific Gravity, Urine: 1.018 (ref 1.005–1.030)
pH: 8 (ref 5.0–8.0)

## 2019-09-21 LAB — I-STAT BETA HCG BLOOD, ED (MC, WL, AP ONLY): I-stat hCG, quantitative: <5 m[IU]/mL (ref <5)

## 2019-09-21 MED ORDER — FENTANYL CITRATE (PF) 100 MCG/2ML IJ SOLN
50.0000 ug | Freq: Once | INTRAMUSCULAR | Status: AC
  Administered 2019-09-21: 50 ug via INTRAVENOUS
  Filled 2019-09-21: qty 2

## 2019-09-21 MED ORDER — ONDANSETRON 4 MG PO TBDP
4.0000 mg | ORAL_TABLET | Freq: Once | ORAL | Status: AC
  Administered 2019-09-21: 4 mg via ORAL
  Filled 2019-09-21: qty 1

## 2019-09-21 NOTE — ED Triage Notes (Signed)
Patient reports left flank pain radiating left lateral abdomen with emesis this afternoon , history of kidney stone , no hematuria or fever .

## 2019-09-22 DIAGNOSIS — M1612 Unilateral primary osteoarthritis, left hip: Secondary | ICD-10-CM | POA: Diagnosis not present

## 2019-09-22 MED ORDER — NAPROXEN 500 MG PO TABS
500.0000 mg | ORAL_TABLET | Freq: Two times a day (BID) | ORAL | 0 refills | Status: DC
Start: 2019-09-22 — End: 2019-12-02

## 2019-09-22 MED ORDER — ONDANSETRON 4 MG PO TBDP
4.0000 mg | ORAL_TABLET | Freq: Three times a day (TID) | ORAL | 0 refills | Status: DC | PRN
Start: 2019-09-22 — End: 2020-08-25

## 2019-09-22 NOTE — ED Provider Notes (Signed)
[facility] EMERGENCY DEPARTMENT Provider Note   CSN: [id number] Arrival date & time: 09/21/19  2132     History Chief Complaint  Patient presents with  . Kidney Stone    [name] is a 49 y.o. female with history of kidney stones, migraine headaches, TMJ disorder presents for evaluation of acute onset, resolved left flank pain.  Symptoms began at around 7 PM last night, radiated to the suprapubic region.  She describes the pain as a severe pressure which worsened with laying and she needed to stand and move around.  She notes associated nausea and has had several episodes of nonbloody nonbilious emesis.  Reports feeling a stabbing sensation when attempting to urinate as well as urgency.  No fevers.  She received nausea medicine in the waiting room which was temporarily helpful but she states it "wore off" and she had another episode of emesis in the waiting room.  Reports that around 2 AM she went to the bathroom and passed a kidney stone with associated hematuria.  Since then she reports her pain has significantly improved and describes feeling generally sore along the abdomen and low back due to the emesis.  She states this feels very similar to the last time she passed a kidney stone years ago.  She does not currently have a urologist.  The history is provided by the patient.       Past Medical History:  Diagnosis Date  . Kidney stones   . Migraine    chronic, daily  . Temporomandibular joint disorders, unspecified     Patient Active Problem List   Diagnosis Date Noted  . Abdominal bloating 04/13/2019  . Elevated liver enzymes 04/12/2019  . Abdominal pain 04/12/2019  . Heartburn 04/12/2019  . Hip dysplasia, acquired, left 12/15/2017  . Hip pain 04/22/2016  . Cervical pain (neck) 11/08/2015  . Routine general medical examination at a health care facility 05/13/2015  . Pre-adoption visit for adoptive parent(s) 05/09/2014  . TEMPOROMANDIBULAR JOINT  DISORDER 01/28/2007  . Migraine without aura 01/13/2007    Past Surgical History:  Procedure Laterality Date  . ABDOMINAL HYSTERECTOMY    . KNEE CARTILAGE SURGERY  1988   torn cartilage  . NECK SURGERY    . PARTIAL HYSTERECTOMY  1/07   Endometriosis; ovarian cyst (1/2 ovary on left)  . TUBAL LIGATION       OB History   No obstetric history on file.     Family History  Problem Relation Age of Onset  . Diabetes Father   . Color blindness Father   . Hypertension Mother   . Diabetes Other        GM  . Ovarian cancer Paternal Grandmother   . Cataracts Paternal Grandmother   . Pancreatitis Paternal Grandfather   . Pancreatic cancer Paternal Grandfather   . Cataracts Other        Aunt  . Cataracts Son        childhood  . Hypertension Sister   . Melanoma Other        grandparent  . Migraines Other        uncle  . Esophageal cancer Maternal Grandmother   . Colon cancer Neg Hx   . Rectal cancer Neg Hx   . Stomach cancer Neg Hx     Social History   Tobacco Use  . Smoking status: Never Smoker  . Smokeless tobacco: Never Used  Substance Use Topics  . Alcohol use: No  Alcohol/week: 0.0 standard drinks  . Drug use: No    Home Medications Prior to Admission medications   Medication Sig Start Date End Date Taking? Authorizing Provider  Multiple Vitamins-Minerals (MULTIVITAMIN WITH MINERALS) tablet Take 1 tablet by mouth daily.   Yes [provider]  naproxen (NAPROSYN) 500 MG tablet Take 1 tablet (500 mg total) by mouth 2 (two) times daily with a meal. 09/22/19   Clanton Emanuelson A, PA-C  ondansetron (ZOFRAN ODT) 4 MG disintegrating tablet Take 1 tablet (4 mg total) by mouth every 8 (eight) hours as needed for nausea or vomiting. 09/22/19   Rodell Perna A, PA-C    Allergies    Morphine sulfate  Review of Systems   Review of Systems  Constitutional: Negative for fever.  Respiratory: Negative for shortness of breath.   Cardiovascular: Negative for chest pain.    Gastrointestinal: Positive for nausea and vomiting.  Genitourinary: Positive for dysuria, flank pain, hematuria and urgency.  All other systems reviewed and are negative.   Physical Exam Updated Vital Signs BP (!) 125/97 (BP Location: Right Arm)   Pulse 68   Temp 98.1 F (36.7 C) (Oral)   Resp 16   Ht 5' 4"  (1.626 m)   Wt 75 kg   SpO2 100%   BMI 28.38 kg/m   Physical Exam Vitals and nursing note reviewed.  Constitutional:      General: She is not in acute distress.    Appearance: She is well-developed.  HENT:     Head: Normocephalic and atraumatic.  Eyes:     General:        Right eye: No discharge.        Left eye: No discharge.     Conjunctiva/sclera: Conjunctivae normal.  Neck:     Vascular: No JVD.     Trachea: No tracheal deviation.  Cardiovascular:     Rate and Rhythm: Normal rate and regular rhythm.  Pulmonary:     Effort: Pulmonary effort is normal.     Breath sounds: Normal breath sounds.  Abdominal:     General: Abdomen is flat. Bowel sounds are normal. There is no distension.     Palpations: Abdomen is soft.     Tenderness: There is no abdominal tenderness. There is left CVA tenderness. There is no right CVA tenderness, guarding or rebound.     Comments: Mild left CVA tenderness.  She has what appears to be a 2 mm kidney stone wrapped up in tissue paper in her wallet.  Musculoskeletal:     Cervical back: Neck supple.     Comments: No midline lumbar spine tenderness, left paralumbar muscle tenderness.  No deformity, crepitus, or step-off.  Skin:    General: Skin is warm and dry.     Findings: No erythema.  Neurological:     Mental Status: She is alert.  Psychiatric:        Behavior: Behavior normal.     ED Results / Procedures / Treatments   Labs (all labs ordered are listed, but only abnormal results are displayed) Labs Reviewed  BASIC METABOLIC PANEL - Abnormal; Notable for the following components:      Result Value   Glucose, Bld 116 (*)     All other components within normal limits  URINALYSIS, ROUTINE W REFLEX MICROSCOPIC  CBC WITH DIFFERENTIAL/PLATELET  I-STAT BETA HCG BLOOD, ED ([facility], [facility], [facility] ONLY)    EKG None  Radiology No results found.  Procedures Procedures (including critical care time)  Medications  Ordered in ED Medications  ondansetron (ZOFRAN-ODT) disintegrating tablet 4 mg (4 mg Oral Given 09/21/19 2203)  fentaNYL (SUBLIMAZE) injection 50 mcg (50 mcg Intravenous Given 09/21/19 2212)    ED Course  I have reviewed the triage vital signs and the nursing notes.  Pertinent labs & imaging results that were available during my care of the patient were reviewed by me and considered in my medical decision making (see chart for details).    MDM Rules/Calculators/A&P                          Patient presenting for evaluation of left-sided flank pain with associated nausea and vomiting and urinary symptoms.  She is afebrile, vital signs are stable.  She is nontoxic in appearance.  While in the waiting room she apparently passed a kidney stone which she showed me.  At this time she is complaining of generalized abdominal soreness and low back soreness due to her persistent emesis.  Her abdomen is soft and nontender with no rebound or guarding.  She exhibits no midline spine tenderness.  Overall physical examination is reassuring.  Lab work reviewed and interpreted by myself show no leukocytosis, no anemia, no metabolic derangements, no renal insufficiency.  She had no right-sided abdominal pain to suggest hepatobiliary dysfunction and I doubt pancreatitis, appendicitis, ectopic pregnancy, TOA or ovarian torsion.  Doubt acute surgical abdominal pathology.  I suspect that she likely was passing a kidney stone and it appears she has successfully done so.  Her symptoms have improved.  She was given ginger ale and crackers which she tolerated without difficulty.  Recommend close follow-up with urology on an outpatient basis.   Discussed symptomatic management at home at this time with Zofran and NSAIDs.  Discussed strict ED return precautions. Patient verbalized understanding of and agreement with plan and is safe for discharge home at this time.    Final Clinical Impression(s) / ED Diagnoses Final diagnoses:  Kidney stone    Rx / DC Orders ED Discharge Orders         Ordered    ondansetron (ZOFRAN ODT) 4 MG disintegrating tablet  Every 8 hours PRN     Discontinue  Reprint     09/22/19 0702    naproxen (NAPROSYN) 500 MG tablet  2 times daily with meals     Discontinue  Reprint     09/22/19 0702           Renita Papa, PA-C 29/56/21 3086    Delora Fuel, MD 57/84/69 2010

## 2019-09-22 NOTE — Discharge Instructions (Signed)
1. Medications: Take naproxen twice daily with meals and [phone] mg of Tylenol every 6 hours as needed for pain. Do not exceed 4000 mg of Tylenol daily.  Take naproxen with food to avoid upset stomach issues and avoid other NSAIDs while taking this.  Take Zofran as needed for nausea.  Let this medicine dissolve under your tongue and wait around 10-15 minutes before eating or drinking after taking this medication to give it time to work.  2. Treatment: rest, drink plenty of fluids.  See attached information regarding dietary guidelines. 3. Follow Up: Please followup with urology as soon as possible (preferably this week) for discussion of your diagnoses and further evaluation after today's visit; Please return to the ER for persistent vomiting, high fevers or worsening symptoms

## 2019-09-28 DIAGNOSIS — M25552 Pain in left hip: Secondary | ICD-10-CM | POA: Diagnosis not present

## 2019-09-29 DIAGNOSIS — M1612 Unilateral primary osteoarthritis, left hip: Secondary | ICD-10-CM | POA: Diagnosis not present

## 2019-10-18 ENCOUNTER — Encounter: Payer: Self-pay | Admitting: Family Medicine

## 2019-10-22 ENCOUNTER — Encounter: Payer: Self-pay | Admitting: Family Medicine

## 2019-10-22 ENCOUNTER — Other Ambulatory Visit: Payer: Self-pay

## 2019-10-22 ENCOUNTER — Telehealth (INDEPENDENT_AMBULATORY_CARE_PROVIDER_SITE_OTHER): Payer: Federal, State, Local not specified - PPO | Admitting: Family Medicine

## 2019-10-22 DIAGNOSIS — F430 Acute stress reaction: Secondary | ICD-10-CM

## 2019-10-22 MED ORDER — BUSPIRONE HCL 15 MG PO TABS: 7.5000 mg | ORAL_TABLET | Freq: Two times a day (BID) | ORAL | 3 refills | Status: DC

## 2019-10-22 NOTE — Progress Notes (Signed)
Virtual Visit via Video Note  I connected with [name] on 10/22/19 at  2:30 PM EDT by a video enabled telemedicine application and verified that I am speaking with the correct person using two identifiers.  Location: Patient: car/home Provider: office   I discussed the limitations of evaluation and management by telemedicine and the availability of in person appointments. The patient expressed understanding and agreed to proceed.  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   Son and his family live with her-trying to move out  Other child is coming home from college -will be with her temp - and then buy the house and she is figuring out where she is going to live  Needs a place less to take care of  This will be complicated while fostering   Baby still gets her up in the middle of the night   She is tired and overwhelmed and still working and doing school work   Symptoms- not depressed but anxious  Feels like she is on the edge of a panic attack  She likes to be organized and this is stressful  Gets more quiet than irritable  Cannot get things done- laundry /cleaning/meals   Cannot do things for herself or get out    Going through menopause and occ mood swings with that along with sweats  Did a boot camp   At times it takes a long time to get to sleep (busy brain)    PHQ -0 GAD-positive   Patient Active Problem List   Diagnosis Date Noted  . Stress reaction 10/22/2019  . Abdominal bloating 04/13/2019  . Elevated liver enzymes 04/12/2019  . Abdominal pain 04/12/2019  . Heartburn 04/12/2019  . Hip dysplasia, acquired, left 12/15/2017  . Hip pain 04/22/2016  . Cervical pain (neck) 11/08/2015  . Routine general medical examination at a health care facility  05/13/2015  . Pre-adoption visit for adoptive parent(s) 05/09/2014  . 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 Migraine without aura 01/13/2007   Past Medical History:  Diagnosis Date  . Kidney stones   . Migraine    chronic, daily  . Temporomandibular joint disorders, unspecified    Past Surgical History:  Procedure Laterality Date  . ABDOMINAL HYSTERECTOMY    . KNEE CARTILAGE SURGERY  1988   torn cartilage  . NECK SURGERY    . PARTIAL HYSTERECTOMY  1/07   Endometriosis; ovarian cyst (1/2 ovary on left)  . TUBAL LIGATION     Social History   Tobacco Use  . Smoking status: Never Smoker  . Smokeless tobacco: Never Used  Substance Use Topics  . Alcohol use: No    Alcohol/week: 0.0 standard drinks  . Drug use: No   Family History  Problem Relation Age of Onset  . Diabetes Father   . Color blindness Father   . Hypertension Mother   . Diabetes Other        GM  . Ovarian cancer Paternal Grandmother   . Cataracts Paternal Grandmother   . Pancreatitis Paternal Grandfather   . Pancreatic cancer Paternal Grandfather   . Cataracts Other        Aunt  . Cataracts Son        childhood  .  Hypertension Sister   . Melanoma Other        grandparent  . Migraines Other        uncle  . Esophageal cancer Maternal Grandmother   . Colon cancer Neg Hx   . Rectal cancer Neg Hx   . Stomach cancer Neg Hx    Allergies  Allergen Reactions  . Morphine Sulfate Itching    REACTION: unspecified   Current Outpatient Medications on File Prior to Visit  Medication Sig Dispense Refill  . Multiple Vitamins-Minerals (MULTIVITAMIN WITH MINERALS) tablet Take 1 tablet by mouth daily.    . naproxen (NAPROSYN) 500 MG tablet Take 1 tablet (500 mg total) by mouth 2 (two) times daily with a meal. (Patient not taking: Reported on 10/22/2019) 30 tablet 0  . ondansetron (ZOFRAN ODT) 4 MG disintegrating tablet Take 1 tablet (4 mg total) by mouth every 8 (eight) hours as needed for nausea or  vomiting. (Patient not taking: Reported on 10/22/2019) 10 tablet 0   No current facility-administered medications on file prior to visit.   Review of Systems  Constitutional: Negative for chills, fever and malaise/fatigue.       Fatigue  HENT: Negative for congestion, ear pain, sinus pain and sore throat.   Eyes: Negative for blurred vision, discharge and redness.  Respiratory: Negative for cough, shortness of breath and stridor.   Cardiovascular: Negative for chest pain, palpitations and leg swelling.  Gastrointestinal: Negative for abdominal pain, diarrhea, nausea and vomiting.  Musculoskeletal: Negative for myalgias.  Skin: Negative for rash.  Neurological: Negative for dizziness and headaches.  Psychiatric/Behavioral: Negative for depression and suicidal ideas. The patient is nervous/anxious and has insomnia.     Observations/Objective: Patient appears well, in no distress Weight is baseline  No facial swelling or asymmetry Normal voice-not hoarse and no slurred speech No obvious tremor or mobility impairment Moving neck and UEs normally Able to hear the call well  No cough or shortness of breath during interview  Talkative and mentally sharp with no cognitive changes No skin changes on face or neck , no rash or pallor Mood is anxious /affect normal , occ tearful when discussing stressors Good insight/ good historian   Assessment and Plan: Problem List Items Addressed This Visit      Other   Stress reaction    From very busy lifestyle/taking on too much re: family care in addition to fostering young children and worsening  In the face of need for organization- things are cluttered and unable to keep household they way she wants it  Overall good coping mechanisms (incl exercise) and support  Some sleep problems  Reviewed stressors/ coping techniques/symptoms/ support sources/ tx options and side effects in detail today  Disc opt for tx  Ref made for counseling  Also px  buspar 7.5 mg bid for anxiety  Discussed expectations of this medication including time to effectiveness and mechanism of action, also poss of side effects (early and late)- including mental fuzziness, weight or appetite change, nausea and poss of worse dep or anxiety (even suicidal thoughts)  Pt voiced understanding and will stop med and update if this occurs   We can titrate up if helpful and needed  She will check in       Relevant Medications   busPIRone (BUSPAR) 15 MG tablet   Other Relevant Orders   Ambulatory referral to Psychology       Follow Up Instructions: l placed a referral for counseling and the office will call  to set that up  Try buspar 7.5 mg twice daily - stop if any intolerable side effects of if it makes you feel worse or depressed  Take time for self care whenever you can  Let me know in a few weeks how the medicine is working-we can titrate the dose if needed   I discussed the assessment and treatment plan with the patient. The patient was provided an opportunity to ask questions and all were answered. The patient agreed with the plan and demonstrated an understanding of the instructions.   The patient was advised to call back or seek an in-person evaluation if the symptoms worsen or if the condition fails to improve as anticipated.     Loura Pardon, MD

## 2019-10-22 NOTE — Patient Instructions (Addendum)
l placed a referral for counseling and the office will call to set that up  Try buspar 7.5 mg twice daily - stop if any intolerable side effects of if it makes you feel worse or depressed  Take time for self care whenever you can  Let me know in a few weeks how the medicine is working-we can titrate the dose if needed

## 2019-10-24 NOTE — Assessment & Plan Note (Signed)
From very busy lifestyle/taking on too much re: family care in addition to fostering young children and worsening  In the face of need for organization- things are cluttered and unable to keep household they way she wants it  Overall good coping mechanisms (incl exercise) and support  Some sleep problems  Reviewed stressors/ coping techniques/symptoms/ support sources/ tx options and side effects in detail today  Disc opt for tx  Ref made for counseling  Also px buspar 7.5 mg bid for anxiety  Discussed expectations of this medication including time to effectiveness and mechanism of action, also poss of side effects (early and late)- including mental fuzziness, weight or appetite change, nausea and poss of worse dep or anxiety (even suicidal thoughts)  Pt voiced understanding and will stop med and update if this occurs   We can titrate up if helpful and needed  She will check in

## 2019-10-29 ENCOUNTER — Encounter: Payer: Self-pay | Admitting: Family Medicine

## 2019-10-29 ENCOUNTER — Encounter (HOSPITAL_COMMUNITY): Payer: Self-pay | Admitting: Emergency Medicine

## 2019-10-29 ENCOUNTER — Emergency Department (HOSPITAL_COMMUNITY)
Admission: EM | Admit: 2019-10-29 | Discharge: 2019-10-29 | Disposition: A | Discharge status: Home / Self Care | Payer: Federal, State, Local not specified - PPO | Attending: Emergency Medicine | Admitting: Emergency Medicine

## 2019-10-29 ENCOUNTER — Other Ambulatory Visit: Payer: Self-pay

## 2019-10-29 DIAGNOSIS — Z5321 Procedure and treatment not carried out due to patient leaving prior to being seen by health care provider: Secondary | ICD-10-CM | POA: Diagnosis not present

## 2019-10-29 DIAGNOSIS — M791 Myalgia, unspecified site: Secondary | ICD-10-CM | POA: Diagnosis not present

## 2019-10-29 DIAGNOSIS — K920 Hematemesis: Secondary | ICD-10-CM | POA: Diagnosis not present

## 2019-10-29 DIAGNOSIS — R1030 Lower abdominal pain, unspecified: Secondary | ICD-10-CM | POA: Insufficient documentation

## 2019-10-29 DIAGNOSIS — U071 COVID-19: Secondary | ICD-10-CM | POA: Diagnosis not present

## 2019-10-29 LAB — COMPREHENSIVE METABOLIC PANEL
ALT: 30 U/L (ref 0–44)
AST: 41 U/L (ref 15–41)
Albumin: 4.2 g/dL (ref 3.5–5.0)
Alkaline Phosphatase: 53 U/L (ref 38–126)
Anion gap: 10 (ref 5–15)
BUN: 9 mg/dL (ref 6–20)
CO2: 27 mmol/L (ref 22–32)
Calcium: 9.4 mg/dL (ref 8.9–10.3)
Chloride: 101 mmol/L (ref 98–111)
Creatinine, Ser: 0.91 mg/dL (ref 0.44–1.00)
GFR calc Af Amer: >60 mL/min (ref >60)
GFR calc non Af Amer: >60 mL/min (ref >60)
Glucose, Bld: 103 mg/dL — ABNORMAL HIGH (ref 70–99)
Potassium: 3.8 mmol/L (ref 3.5–5.1)
Sodium: 138 mmol/L (ref 135–145)
Total Bilirubin: 0.9 mg/dL (ref 0.3–1.2)
Total Protein: 7.6 g/dL (ref 6.5–8.1)

## 2019-10-29 LAB — CBC
HCT: 41.8 % (ref 36.0–46.0)
Hemoglobin: 13.9 g/dL (ref 12.0–15.0)
MCH: 31.2 pg (ref 26.0–34.0)
MCHC: 33.3 g/dL (ref 30.0–36.0)
MCV: 93.7 fL (ref 80.0–100.0)
Platelets: 168 10*3/uL (ref 150–400)
RBC: 4.46 MIL/uL (ref 3.87–5.11)
RDW: 12 % (ref 11.5–15.5)
WBC: 3.7 10*3/uL — ABNORMAL LOW (ref 4.0–10.5)
nRBC: 0 % (ref 0.0–0.2)

## 2019-10-29 LAB — TYPE AND SCREEN
ABO/RH(D): A POS
Antibody Screen: NEGATIVE

## 2019-10-29 LAB — I-STAT BETA HCG BLOOD, ED (MC, WL, AP ONLY): I-stat hCG, quantitative: <5 m[IU]/mL (ref <5)

## 2019-10-29 NOTE — Telephone Encounter (Signed)
Pt said for 2 days has been vomiting and this morning pt had bright red bleeding when vomited this morning. Pt has upper and lower abd pain that is cramp like and pain level is 5-6. Pt last vomited at 6:30 AM. Pt has not eaten today due to nausea and afraid will vomit. Pt has been drinking water because stays thirsty; dry mouth and dizziness. Advised pt needs eval at ED due to possible dehydration. Pt will go to [facility] by vehicle (pts family will take pt to ED) FYI to DR [employer].

## 2019-10-29 NOTE — ED Triage Notes (Signed)
Started a new medication 1 week ago and developed nausea with lower abdominal cramping contractions feeling. Stated emesis bright red blood once today. Spoke with doctor office told to go to the ED for evaluation.

## 2019-11-13 ENCOUNTER — Other Ambulatory Visit: Payer: Self-pay | Admitting: Family Medicine

## 2019-11-16 DIAGNOSIS — Z03818 Encounter for observation for suspected exposure to other biological agents ruled out: Secondary | ICD-10-CM | POA: Diagnosis not present

## 2019-11-16 DIAGNOSIS — R519 Headache, unspecified: Secondary | ICD-10-CM | POA: Diagnosis not present

## 2019-11-16 DIAGNOSIS — Z20822 Contact with and (suspected) exposure to covid-19: Secondary | ICD-10-CM | POA: Diagnosis not present

## 2019-11-16 DIAGNOSIS — Z1152 Encounter for screening for COVID-19: Secondary | ICD-10-CM | POA: Diagnosis not present

## 2019-11-30 ENCOUNTER — Encounter: Payer: Self-pay | Admitting: Family Medicine

## 2019-12-02 MED ORDER — ESCITALOPRAM OXALATE 10 MG PO TABS
10.0000 mg | ORAL_TABLET | Freq: Every day | ORAL | 3 refills | Status: DC
Start: 2019-12-02 — End: 2019-12-27

## 2019-12-21 ENCOUNTER — Ambulatory Visit (INDEPENDENT_AMBULATORY_CARE_PROVIDER_SITE_OTHER): Payer: Federal, State, Local not specified - PPO | Admitting: Psychology

## 2019-12-21 DIAGNOSIS — F411 Generalized anxiety disorder: Secondary | ICD-10-CM | POA: Diagnosis not present

## 2019-12-26 ENCOUNTER — Other Ambulatory Visit: Payer: Self-pay | Admitting: Family Medicine

## 2019-12-29 DIAGNOSIS — M1612 Unilateral primary osteoarthritis, left hip: Secondary | ICD-10-CM | POA: Diagnosis not present

## 2020-01-05 ENCOUNTER — Ambulatory Visit (INDEPENDENT_AMBULATORY_CARE_PROVIDER_SITE_OTHER): Payer: Federal, State, Local not specified - PPO | Admitting: Psychology

## 2020-01-05 DIAGNOSIS — F411 Generalized anxiety disorder: Secondary | ICD-10-CM

## 2020-03-23 ENCOUNTER — Other Ambulatory Visit: Payer: Self-pay | Admitting: Family Medicine

## 2020-03-24 NOTE — Telephone Encounter (Signed)
Med refilled once and Morey Hummingbird will reach out to pt to try and get appt scheduled

## 2020-03-24 NOTE — Telephone Encounter (Signed)
No recent or future appts and per last mychart pt was suppose to f/u with PCP to let her know how med is, please advise

## 2020-03-24 NOTE — Telephone Encounter (Signed)
Please schedule f/u and refill until then

## 2020-03-27 NOTE — Telephone Encounter (Signed)
I left a voice mail on patient's voice mail. If she returns my call, please schedule f/u appointment with Dr.Tower.

## 2020-04-25 ENCOUNTER — Telehealth: Payer: Self-pay | Admitting: *Deleted

## 2020-04-25 NOTE — Telephone Encounter (Signed)
Aware, will see her then

## 2020-04-25 NOTE — Telephone Encounter (Signed)
Patient called stating that her headaches are becoming more severe. Patient stated that her shoulders hurt and it is hard for her to cross her arms at times. Patient stated when she gets up in the mornings and she feels like she has been hit by a [employer] truck. Patient stated at times when she gets up during the night her feet and hands are numb. Patient stated that these symptoms have been going on for over a month. Patient scheduled for an appointment with Dr. Glori Bickers Thursday 04/27/20 at 8:00 am. Patient was given ER precautions and she verbalized understanding. Patient had a negative covid screening.

## 2020-04-27 ENCOUNTER — Ambulatory Visit (INDEPENDENT_AMBULATORY_CARE_PROVIDER_SITE_OTHER): Payer: Federal, State, Local not specified - PPO | Admitting: Family Medicine

## 2020-04-27 ENCOUNTER — Other Ambulatory Visit: Payer: Self-pay

## 2020-04-27 ENCOUNTER — Other Ambulatory Visit: Payer: Self-pay | Admitting: Family Medicine

## 2020-04-27 ENCOUNTER — Encounter: Payer: Self-pay | Admitting: Family Medicine

## 2020-04-27 VITALS — BP 106/62 | HR 103 | Temp 98.1°F | Ht 66.0 in | Wt 151.5 lb

## 2020-04-27 DIAGNOSIS — R202 Paresthesia of skin: Secondary | ICD-10-CM

## 2020-04-27 DIAGNOSIS — F430 Acute stress reaction: Secondary | ICD-10-CM

## 2020-04-27 DIAGNOSIS — R300 Dysuria: Secondary | ICD-10-CM

## 2020-04-27 DIAGNOSIS — R5382 Chronic fatigue, unspecified: Secondary | ICD-10-CM

## 2020-04-27 DIAGNOSIS — N3000 Acute cystitis without hematuria: Secondary | ICD-10-CM | POA: Diagnosis not present

## 2020-04-27 DIAGNOSIS — G43009 Migraine without aura, not intractable, without status migrainosus: Secondary | ICD-10-CM | POA: Diagnosis not present

## 2020-04-27 DIAGNOSIS — M2550 Pain in unspecified joint: Secondary | ICD-10-CM

## 2020-04-27 DIAGNOSIS — R5383 Other fatigue: Secondary | ICD-10-CM | POA: Insufficient documentation

## 2020-04-27 DIAGNOSIS — M542 Cervicalgia: Secondary | ICD-10-CM

## 2020-04-27 LAB — CBC WITH DIFFERENTIAL/PLATELET
Basophils Absolute: 0 10*3/uL (ref 0.0–0.1)
Basophils Relative: 0.3 % (ref 0.0–3.0)
Eosinophils Absolute: 0 10*3/uL (ref 0.0–0.7)
Eosinophils Relative: 0 % (ref 0.0–5.0)
HCT: 35.9 % — ABNORMAL LOW (ref 36.0–46.0)
Hemoglobin: 12.3 g/dL (ref 12.0–15.0)
Lymphocytes Relative: 9.2 % — ABNORMAL LOW (ref 12.0–46.0)
Lymphs Abs: 0.8 10*3/uL (ref 0.7–4.0)
MCHC: 34.2 g/dL (ref 30.0–36.0)
MCV: 89 fl (ref 78.0–100.0)
Monocytes Absolute: 1.1 10*3/uL — ABNORMAL HIGH (ref 0.1–1.0)
Monocytes Relative: 12.7 % — ABNORMAL HIGH (ref 3.0–12.0)
Neutro Abs: 6.7 10*3/uL (ref 1.4–7.7)
Neutrophils Relative %: 77.8 % — ABNORMAL HIGH (ref 43.0–77.0)
Platelets: 215 10*3/uL (ref 150.0–400.0)
RBC: 4.03 Mil/uL (ref 3.87–5.11)
RDW: 12.6 % (ref 11.5–15.5)
WBC: 8.6 10*3/uL (ref 4.0–10.5)

## 2020-04-27 LAB — POC URINALSYSI DIPSTICK (AUTOMATED)
Bilirubin, UA: NEGATIVE
Blood, UA: 200
Glucose, UA: NEGATIVE
Ketones, UA: 1
Nitrite, UA: POSITIVE
Protein, UA: POSITIVE — AB
Spec Grav, UA: 1.025 (ref 1.010–1.025)
Urobilinogen, UA: 0.2 E.U./dL
pH, UA: 6 (ref 5.0–8.0)

## 2020-04-27 LAB — COMPREHENSIVE METABOLIC PANEL WITH GFR
ALT: 26 U/L (ref 0–35)
AST: 27 U/L (ref 0–37)
Albumin: 4.5 g/dL (ref 3.5–5.2)
Alkaline Phosphatase: 80 U/L (ref 39–117)
BUN: 15 mg/dL (ref 6–23)
CO2: 31 meq/L (ref 19–32)
Calcium: 9.5 mg/dL (ref 8.4–10.5)
Chloride: 96 meq/L (ref 96–112)
Creatinine, Ser: 0.83 mg/dL (ref 0.40–1.20)
GFR: 83.05 mL/min
Glucose, Bld: 100 mg/dL — ABNORMAL HIGH (ref 70–99)
Potassium: 3.9 meq/L (ref 3.5–5.1)
Sodium: 134 meq/L — ABNORMAL LOW (ref 135–145)
Total Bilirubin: 1 mg/dL (ref 0.2–1.2)
Total Protein: 7.5 g/dL (ref 6.0–8.3)

## 2020-04-27 LAB — VITAMIN B12: Vitamin B-12: 684 pg/mL (ref 211–911)

## 2020-04-27 LAB — C-REACTIVE PROTEIN: CRP: 16.3 mg/dL (ref 0.5–20.0)

## 2020-04-27 LAB — SEDIMENTATION RATE: Sed Rate: 49 mm/h — ABNORMAL HIGH (ref 0–20)

## 2020-04-27 MED ORDER — CYCLOBENZAPRINE HCL 10 MG PO TABS: 5.0000 mg | ORAL_TABLET | Freq: Every day | ORAL | 1 refills | Status: DC

## 2020-04-27 MED ORDER — SULFAMETHOXAZOLE-TRIMETHOPRIM 800-160 MG PO TABS: 1.0000 | ORAL_TABLET | Freq: Two times a day (BID) | ORAL | 0 refills | Status: DC

## 2020-04-27 NOTE — Assessment & Plan Note (Signed)
tx with bactrim [employer] fluids Alert if worse or not improving  cx pending  This could add to her body pain and headache

## 2020-04-27 NOTE — Patient Instructions (Addendum)
Try flexeril 1/2 to 1 pill at night for sleep and to relax muscles   Labs today   Take bactrim for uti  We will let you know when the culture   Drink lots of fluids

## 2020-04-27 NOTE — Assessment & Plan Note (Signed)
Ongoing  Muscle tension may transform into headache Open to PT in the future (helped in the past)

## 2020-04-27 NOTE — Assessment & Plan Note (Signed)
Taking lexapro 10  May be open to inc to 20 mg daily for stress reaction and headaches

## 2020-04-27 NOTE — Assessment & Plan Note (Signed)
Multiple Also some myalgia and headache  No joint swelling or erythema  Added cbc and ana and RF today to labs  Reassuring exam

## 2020-04-27 NOTE — Assessment & Plan Note (Signed)
B12 level today  This may be positional Nl exam

## 2020-04-27 NOTE — Progress Notes (Signed)
Subjective:    Patient ID: [name], female    DOB: Sep 03, 1970, 50 y.o.   MRN: [id number]  This visit occurred during the SARS-CoV-2 public health emergency.  Safety protocols were in place, including screening questions prior to the visit, additional usage of staff PPE, and extensive cleaning of exam room while observing appropriate contact time as indicated for disinfecting solutions.    HPI Pt presents for headaches , also urinary symptoms   Wt Readings from Last 3 Encounters:  04/27/20 151 lb 8 oz (68.7 kg)  10/29/19 153 lb (69.4 kg)  10/22/19 156 lb 6.4 oz (70.9 kg)   24.45 kg/m  Urine symptoms started close to a month ago  Pressure to urinate /discomfort (not burning)  Some frequency -worse at night  No blood in urine  Took azo Urine looks dark  UA pos today   She called on 1/18 saying headaches are more severe  Shoulders hurt- hard to gross her arms at times  Hands tingle at night  Joint pain /tender skin   Head hurts more frequently  This one - 3 days long   (other ones last 1-2 d)  avg of one a week  Pressure in eyes  Discomfort to brush her hair  Both sides  Sometimes throbs  Sensitive to light and sound (even went to have eyes checked)  No nausea  Sometimes worse with exertion  Trigger is change in weather   Appetite is down   She takes 1/2 of a flexeril  excedrin migraine   Decaf coffee and no tea     Takes lexapro for stress rxn/anxiety  Sleep is bad-she is up a lot  Does not sleep the whole night -gets up a lot  Body pain  Hands go numb /occ feet - has to shake them out   Feels like hands swell No swollen joints however    Hormone status-had partial hysterectomy   Intol of elavil  Also gabapentin- nausea   Patient Active Problem List   Diagnosis Date Noted  . Acute cystitis 04/27/2020  . Paresthesia of both hands 04/27/2020  . Fatigue 04/27/2020  . Joint pain 04/27/2020  . Stress reaction 10/22/2019  . Elevated liver  enzymes 04/12/2019  . Heartburn 04/12/2019  . Hip dysplasia, acquired, left 12/15/2017  . Hip pain 04/22/2016  . Cervical pain (neck) 11/08/2015  . Routine general medical examination at a health care facility 05/13/2015  . Pre-adoption visit for adoptive parent(s) 05/09/2014  . 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 Migraine without aura 01/13/2007   Past Medical History:  Diagnosis Date  . Kidney stones   . Migraine    chronic, daily  . Temporomandibular joint disorders, unspecified    Past Surgical History:  Procedure Laterality Date  . ABDOMINAL HYSTERECTOMY    . KNEE CARTILAGE SURGERY  1988   torn cartilage  . NECK SURGERY    . PARTIAL HYSTERECTOMY  1/07   Endometriosis; ovarian cyst (1/2 ovary on left)  . TUBAL LIGATION     Social History   Tobacco Use  . Smoking status: Never Smoker  . Smokeless tobacco: Never Used  Substance Use Topics  . Alcohol use: No    Alcohol/week: 0.0 standard drinks  . Drug use: No   Family History  Problem Relation Age of Onset  . Diabetes Father   . Color blindness Father   . Hypertension Mother   . Diabetes Other        GM  .  Ovarian cancer Paternal Grandmother   . Cataracts Paternal Grandmother   . Pancreatitis Paternal Grandfather   . Pancreatic cancer Paternal Grandfather   . Cataracts Other        Aunt  . Cataracts Son        childhood  . Hypertension Sister   . Melanoma Other        grandparent  . Migraines Other        uncle  . Esophageal cancer Maternal Grandmother   . Colon cancer Neg Hx   . Rectal cancer Neg Hx   . Stomach cancer Neg Hx    Allergies  Allergen Reactions  . Buspar [Buspirone]     Nausea   . Morphine Sulfate Itching    REACTION: unspecified   Current Outpatient Medications on File Prior to Visit  Medication Sig Dispense Refill  . Multiple Vitamins-Minerals (MULTIVITAMIN WITH MINERALS) tablet Take 1 tablet by mouth daily.    . ondansetron (ZOFRAN ODT) 4 MG disintegrating tablet  Take 1 tablet (4 mg total) by mouth every 8 (eight) hours as needed for nausea or vomiting. 10 tablet 0   No current facility-administered medications on file prior to visit.    Review of Systems  Constitutional: Positive for fatigue. Negative for activity change, appetite change, fever and unexpected weight change.  HENT: Negative for congestion, ear pain, rhinorrhea, sinus pressure and sore throat.   Eyes: Negative for pain, redness and visual disturbance.  Respiratory: Negative for cough, shortness of breath and wheezing.   Cardiovascular: Negative for chest pain and palpitations.  Gastrointestinal: Negative for abdominal pain, blood in stool, constipation and diarrhea.  Endocrine: Negative for polydipsia and polyuria.  Genitourinary: Positive for dysuria, frequency and urgency. Negative for flank pain and hematuria.  Musculoskeletal: Positive for arthralgias, back pain, myalgias and neck pain. Negative for joint swelling.  Skin: Negative for pallor and rash.  Allergic/Immunologic: Negative for environmental allergies.  Neurological: Positive for headaches. Negative for dizziness, tremors, syncope and light-headedness.  Hematological: Negative for adenopathy. Does not bruise/bleed easily.  Psychiatric/Behavioral: Negative for decreased concentration and dysphoric mood. The patient is nervous/anxious.        Objective:   Physical Exam Constitutional:      General: She is not in acute distress.    Appearance: She is well-developed, normal weight and well-nourished. She is not ill-appearing or diaphoretic.  HENT:     Head: Normocephalic and atraumatic.     Comments: Mild temple tenderness bilat  No sinus tenderness      Right Ear: External ear normal.     Left Ear: External ear normal.     Nose: Nose normal.     Mouth/Throat:     Mouth: Oropharynx is clear and moist.     Pharynx: No oropharyngeal exudate.      Comments: No sinus tenderness No temporal tenderness  No TMJ  tendernessEyes:     General: No scleral icterus.       Right eye: No discharge.        Left eye: No discharge.     Extraocular Movements: EOM normal.     Conjunctiva/sclera: Conjunctivae normal.     Pupils: Pupils are equal, round, and reactive to light.     Comments: No nystagmus  Neck:     Thyroid: No thyromegaly.     Vascular: No carotid bruit or JVD.     Trachea: No tracheal deviation.  Cardiovascular:     Rate and Rhythm: Regular rhythm. Tachycardia present.  Heart sounds: Normal heart sounds. No murmur heard.   Pulmonary:     Effort: Pulmonary effort is normal. No respiratory distress.     Breath sounds: Normal breath sounds. No wheezing or rales.  Abdominal:     General: Bowel sounds are normal. There is no distension.     Palpations: Abdomen is soft. There is no mass.     Tenderness: There is abdominal tenderness. There is no right CVA tenderness or left CVA tenderness.     Comments: Mild suprapubic tenderness w/o fullness  Musculoskeletal:        General: No tenderness or edema.     Cervical back: Full passive range of motion without pain, normal range of motion and neck supple.     Right lower leg: No edema.     Left lower leg: No edema.     Comments: No kyphosis  Some mcp tenderness in hands No erythematous/warm or swollen joints Nl rom   CS tenderness lower  Some neck crepitus  Lymphadenopathy:     Cervical: No cervical adenopathy.  Skin:    General: Skin is warm and dry.     Coloration: Skin is not pale.     Findings: No bruising, erythema or rash.  Neurological:     Mental Status: She is alert and oriented to person, place, and time.     Cranial Nerves: No cranial nerve deficit.     Sensory: Sensation is intact. No sensory deficit.     Motor: No weakness, tremor, atrophy or abnormal muscle tone.     Coordination: She displays a negative Romberg sign. Coordination is intact. Coordination normal.     Gait: Gait is intact. Gait normal.     Deep Tendon  Reflexes: Strength normal and reflexes are normal and symmetric. Reflexes normal.     Comments: No focal cerebellar signs   Psychiatric:        Mood and Affect: Mood and affect normal.        Behavior: Behavior normal.        Thought Content: Thought content normal.        Cognition and Memory: Cognition and memory normal.           Assessment & Plan:   Problem List Items Addressed This Visit      Cardiovascular and Mediastinum   Migraine without aura - Primary    Suspect tension headaches that transform  Some arthralgias and temple tenderness- ESR and CRP drawn to r/o temporal arteritis  Encouraged self care and better hydration  tx uti also  Has failed elavil and gabapentin  Taking ssri  Had recent eye exam       Relevant Medications   cyclobenzaprine (FLEXERIL) 10 MG tablet   Other Relevant Orders   Sedimentation Rate (Completed)   C-reactive protein (Completed)     Genitourinary   Acute cystitis    tx with bactrim DS Inc fluids Alert if worse or not improving  cx pending  This could add to her body pain and headache       Relevant Orders   Urine Culture     Other   Cervical pain (neck)    Ongoing  Muscle tension may transform into headache Open to PT in the future (helped in the past)       Stress reaction    Taking lexapro 10  May be open to inc to 20 mg daily for stress reaction and headaches       Paresthesia  of both hands    B12 level today  This may be positional Nl exam        Relevant Orders   Vitamin B12 (Completed)   Fatigue    Cbc and tsh with labs  Multiple somatic c/o and also current uti adding       Relevant Orders   CBC with Differential/Platelet (Completed)   Comprehensive metabolic panel (Completed)   Joint pain    Multiple Also some myalgia and headache  No joint swelling or erythema  Added cbc and ana and RF today to labs  Reassuring exam       Relevant Orders   Sedimentation Rate (Completed)   C-reactive  protein (Completed)   ANA   Rheumatoid factor   CBC with Differential/Platelet (Completed)    Other Visit Diagnoses    Dysuria       Relevant Orders   POCT Urinalysis Dipstick (Automated) (Completed)

## 2020-04-27 NOTE — Assessment & Plan Note (Signed)
Suspect tension headaches that transform  Some arthralgias and temple tenderness- ESR and CRP drawn to r/o temporal arteritis  Encouraged self care and better hydration  tx uti also  Has failed elavil and gabapentin  Taking ssri  Had recent eye exam

## 2020-04-27 NOTE — Assessment & Plan Note (Signed)
Cbc and tsh with labs  Multiple somatic c/o and also current uti adding

## 2020-04-28 ENCOUNTER — Telehealth: Payer: Self-pay | Admitting: Family Medicine

## 2020-04-28 MED ORDER — PREDNISONE 10 MG PO TABS: ORAL_TABLET | ORAL | 0 refills | Status: DC

## 2020-04-28 NOTE — Telephone Encounter (Signed)
-----   Message from Ronna Polio, RN sent at 04/28/2020  2:24 PM EST ----- Patient stated that she would be interested in trying the prednisone and would like it sent to [facility] in [place].

## 2020-04-29 LAB — URINE CULTURE
MICRO NUMBER:: 11438664
SPECIMEN QUALITY:: ADEQUATE

## 2020-04-29 LAB — ANA: Anti Nuclear Antibody (ANA): NEGATIVE

## 2020-04-29 LAB — RHEUMATOID FACTOR: Rheumatoid fact SerPl-aCnc: 16 IU/mL — ABNORMAL HIGH (ref <14)

## 2020-08-06 ENCOUNTER — Other Ambulatory Visit: Payer: Self-pay | Admitting: Family Medicine

## 2020-08-07 NOTE — Telephone Encounter (Signed)
Last filled on 04/27/20 #30 tabs with 1 refill. Last OV was HA/ Shoulder pain on 04/27/20

## 2020-08-25 ENCOUNTER — Encounter: Payer: Self-pay | Admitting: Family Medicine

## 2020-08-25 ENCOUNTER — Other Ambulatory Visit: Payer: Self-pay

## 2020-08-25 ENCOUNTER — Ambulatory Visit: Payer: Federal, State, Local not specified - PPO | Admitting: Family Medicine

## 2020-08-25 VITALS — BP 126/84 | HR 86 | Temp 96.9°F | Ht 66.0 in | Wt 150.6 lb

## 2020-08-25 DIAGNOSIS — Z205 Contact with and (suspected) exposure to viral hepatitis: Secondary | ICD-10-CM | POA: Diagnosis not present

## 2020-08-25 NOTE — Progress Notes (Signed)
Subjective:    Patient ID: [name], female    DOB: 1971/01/24, 50 y.o.   MRN: [id number]  This visit occurred during the SARS-CoV-2 public health emergency.  Safety protocols were in place, including screening questions prior to the visit, additional usage of staff PPE, and extensive cleaning of exam room while observing appropriate contact time as indicated for disinfecting solutions.    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 Exposure to hepatitis C 08/25/2020  . Acute cystitis 04/27/2020  . Paresthesia of both hands 04/27/2020  . Fatigue 04/27/2020  . Joint pain 04/27/2020  . Stress reaction 10/22/2019  . Elevated liver enzymes 04/12/2019  . Heartburn 04/12/2019  . Hip dysplasia, acquired, left 12/15/2017  . Hip pain 04/22/2016  . Cervical pain (neck) 11/08/2015  . Routine general medical examination at a health care facility 05/13/2015  . Pre-adoption visit for adoptive parent(s) 05/09/2014  . 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 Migraine without aura 01/13/2007   Past Medical History:  Diagnosis Date  . Kidney stones   . Migraine    chronic, daily  . Temporomandibular joint disorders, unspecified    Past Surgical History:  Procedure Laterality Date  .  ABDOMINAL HYSTERECTOMY    . KNEE CARTILAGE SURGERY  1988   torn cartilage  . NECK SURGERY    . PARTIAL HYSTERECTOMY  1/07   Endometriosis; ovarian cyst (1/2 ovary on left)  . TUBAL LIGATION     Social History   Tobacco Use  . Smoking status: Never Smoker  . Smokeless tobacco: Never Used  Substance Use Topics  . Alcohol use: No    Alcohol/week: 0.0 standard drinks  . Drug use: No   Family History  Problem Relation Age of Onset  . Diabetes Father   . Color blindness Father   . Hypertension Mother   . Diabetes Other        GM  . Ovarian cancer Paternal Grandmother   . Cataracts Paternal Grandmother   . Pancreatitis Paternal Grandfather   . Pancreatic cancer Paternal Grandfather   . Cataracts Other        Aunt  . Cataracts Son        childhood  . Hypertension Sister   . Melanoma Other        grandparent  . Migraines Other        uncle  . Esophageal cancer Maternal Grandmother   . Colon cancer Neg Hx   . Rectal cancer Neg Hx   . Stomach cancer Neg Hx    Allergies  Allergen Reactions  . Buspar [Buspirone]  Nausea   . Morphine Sulfate Itching    Itching and redness of skin   Current Outpatient Medications on File Prior to Visit  Medication Sig Dispense Refill  . cyclobenzaprine (FLEXERIL) 10 MG tablet TAKE 0.5-1 TABLETS (5-10 MG TOTAL) BY MOUTH AT BEDTIME. 30 tablet 1  . escitalopram (LEXAPRO) 10 MG tablet TAKE 1 TABLET BY MOUTH EVERY DAY 90 tablet 3  . Multiple Vitamins-Minerals (MULTIVITAMIN WITH MINERALS) tablet Take 1 tablet by mouth daily.     No current facility-administered medications on file prior to visit.    Review of Systems  Constitutional: Negative for activity change, appetite change, fatigue, fever and unexpected weight change.  HENT: Negative for congestion, ear pain, rhinorrhea, sinus pressure and sore throat.   Eyes: Negative for pain, redness and visual disturbance.  Respiratory: Negative for cough, shortness of breath and wheezing.    Cardiovascular: Negative for chest pain and palpitations.  Gastrointestinal: Negative for abdominal pain, blood in stool, constipation and diarrhea.  Endocrine: Negative for polydipsia and polyuria.  Genitourinary: Negative for dysuria, frequency and urgency.  Musculoskeletal: Negative for arthralgias, back pain and myalgias.  Skin: Negative for pallor and rash.  Allergic/Immunologic: Negative for environmental allergies.  Neurological: Negative for dizziness, syncope and headaches.  Hematological: Negative for adenopathy. Does not bruise/bleed easily.  Psychiatric/Behavioral: Negative for decreased concentration and dysphoric mood. The patient is not nervous/anxious.        Objective:   Physical Exam Constitutional:      General: She is not in acute distress.    Appearance: Normal appearance. She is normal weight. She is not ill-appearing or diaphoretic.  Eyes:     General: No scleral icterus.    Conjunctiva/sclera: Conjunctivae normal.     Pupils: Pupils are equal, round, and reactive to light.  Cardiovascular:     Rate and Rhythm: Normal rate and regular rhythm.  Pulmonary:     Effort: Pulmonary effort is normal. No respiratory distress.  Musculoskeletal:     Cervical back: Normal range of motion.     Right lower leg: No edema.     Left lower leg: No edema.  Skin:    General: Skin is warm and dry.     Coloration: Skin is not jaundiced.  Neurological:     Mental Status: She is alert.  Psychiatric:        Mood and Affect: Mood normal.           Assessment & Plan:   Problem List Items Addressed This Visit      Other   Exposure to hepatitis C - Primary    Pt cares for 18 mo foster child who has hep C (from [place] mother) Pt has no symptoms but wanted to be screened Used gloves for diaper change or exp to body fluids  Using caution and has disc precautions with ped GI specialist Hep C screen today      Relevant Orders   Hepatitis C antibody

## 2020-08-25 NOTE — Patient Instructions (Signed)
Lab for hep C today   Please use precautions for child care  Let me know if you have any further concerns  If you develop any illness/ abdominal pain or other symptoms please alert [place]

## 2020-08-25 NOTE — Assessment & Plan Note (Signed)
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

## 2020-08-28 LAB — HEPATITIS C ANTIBODY
Hepatitis C Ab: NONREACTIVE
SIGNAL TO CUT-OFF: 0.01 (ref <1.00)

## 2020-09-13 DIAGNOSIS — M542 Cervicalgia: Secondary | ICD-10-CM | POA: Diagnosis not present

## 2020-09-13 DIAGNOSIS — M47892 Other spondylosis, cervical region: Secondary | ICD-10-CM | POA: Diagnosis not present

## 2020-09-21 DIAGNOSIS — M542 Cervicalgia: Secondary | ICD-10-CM | POA: Diagnosis not present

## 2020-09-21 DIAGNOSIS — M5020 Other cervical disc displacement, unspecified cervical region: Secondary | ICD-10-CM | POA: Diagnosis not present

## 2020-09-21 DIAGNOSIS — M5010 Cervical disc disorder with radiculopathy, unspecified cervical region: Secondary | ICD-10-CM | POA: Diagnosis not present

## 2020-09-26 ENCOUNTER — Ambulatory Visit: Payer: Federal, State, Local not specified - PPO | Admitting: Internal Medicine

## 2020-09-27 ENCOUNTER — Ambulatory Visit (INDEPENDENT_AMBULATORY_CARE_PROVIDER_SITE_OTHER)
Admission: RE | Admit: 2020-09-27 | Discharge: 2020-09-27 | Disposition: A | Discharge status: Home / Self Care | Payer: Federal, State, Local not specified - PPO | Source: Ambulatory Visit | Attending: Family Medicine | Admitting: Family Medicine

## 2020-09-27 ENCOUNTER — Other Ambulatory Visit: Payer: Self-pay

## 2020-09-27 ENCOUNTER — Encounter: Payer: Self-pay | Admitting: Family Medicine

## 2020-09-27 ENCOUNTER — Ambulatory Visit: Payer: Federal, State, Local not specified - PPO | Admitting: Family Medicine

## 2020-09-27 VITALS — BP 112/72 | HR 87 | Temp 98.0°F | Ht 66.0 in | Wt 151.2 lb

## 2020-09-27 DIAGNOSIS — M7711 Lateral epicondylitis, right elbow: Secondary | ICD-10-CM | POA: Diagnosis not present

## 2020-09-27 DIAGNOSIS — M25511 Pain in right shoulder: Secondary | ICD-10-CM

## 2020-09-27 DIAGNOSIS — M5412 Radiculopathy, cervical region: Secondary | ICD-10-CM

## 2020-09-27 DIAGNOSIS — M542 Cervicalgia: Secondary | ICD-10-CM

## 2020-09-27 DIAGNOSIS — M7541 Impingement syndrome of right shoulder: Secondary | ICD-10-CM | POA: Diagnosis not present

## 2020-09-27 MED ORDER — PREDNISONE 20 MG PO TABS: ORAL_TABLET | ORAL | 0 refills | Status: DC

## 2020-09-27 NOTE — Progress Notes (Signed)
Alyssa Guaman T. Tanishi Nault, [name], [place] at [facility] [place], [address]  Phone: [phone]  FAX: [phone]  [name] - 50 y.o. female  MRN [id number]  Date of Birth: 1970/04/10  Date: 09/27/2020  PCP: Abner Greenspan, [name]  Referral: Abner Greenspan, [name]  Chief Complaint  Patient presents with   Arm Pain    Right   Shoulder Pain    Right   Elbow Pain    Right    This visit occurred during the SARS-CoV-2 public health emergency.  Safety protocols were in place, including screening questions prior to the visit, additional usage of staff PPE, and extensive cleaning of exam room while observing appropriate contact time as indicated for disinfecting solutions.   Subjective:   [name] is a 50 y.o. very pleasant female patient with Body mass index is 24.41 kg/m. who presents with the following:  R shoulder, elbow, arm are all hurting.  She has had a history of prior significant neurosurgical intervention, and on chart review she had a C5-C6 decompression by Dr. Ellene Route.  Numbness in her right hand, will have pain across her R arm.  If goes it will go all the way down her elbow.  She has had this persistently, and it has never fully resolved, but significantly improved after decompression.  Feels heavy on the inside.   Hand hard to grip.  Neurosurgery notes, recent are not available. She has good follow-up, and Dr. Ellene Route sees her every 6 months.  She had a very recent x-ray series done within the last few weeks. Regular 6 month follow-ups. 2017.  C3,4,5-6.  7, disc disease, but I do not have any new notes. Dr. Ellene Route   R RTC Impingement.  She also has pain in the plane of abduction and internal range of motion.  Compared to her other issues, this is relatively minor, it is positing her problems on a day-to-day level.  On further questioning, the patient tells me that her elbow is the most painful part:  Patient describes a dull ache on the lateral elbow. There is some translation in the proximal forearm and in the distal upper arm. It is painful to lift with the hand facing down and to lift with the thumb in an upright position. Supination is painful. Patient points to the lateral epicondyle as the point of maximal tenderness near ECRB.  No prior fractures or operative interventions in the effective hand. Prior PT or HEP: None for the elbow  R forearm tingling R dorsum, but worse glabally to l  R CTS / ulnar nerve  Whole hand with some numbness  Review of Systems is noted in the HPI, as appropriate   Objective:   BP 112/72   Pulse 87   Temp 98 F (36.7 C) (Temporal)   Ht 5' 6"  (1.676 m)   Wt 151 lb 4 oz (68.6 kg)   SpO2 98%   BMI 24.41 kg/m    Elbow: R Ecchymosis or edema: neg ROM: full flexion, extension, pronation, supination Shoulder ROM: Full Flexion: 5/5 Extension: 5/5, PAINFUL Supination: 5/5, PAINFUL Pronation: 5/5 Wrist ext: 5/5 Wrist flexion: 5/5 No gross bony abnormality Varus and Valgus stress: stable ECRB tenderness: YES, TTP Medial epicondyle: NT Lateral epicondyle, resisted wrist extension from wrist full pronation and flexion: PAINFUL grip: 5/5  sensation intact Tinel's, wrist, pos Dorsum on dorsal, lateral wrist with some decreased sensation Otherwise,   Shoulder: R  Inspection: No muscle wasting or winging Ecchymosis/edema: neg  AC joint, scapula, clavicle: NT Abduction: full, 5/5 Flexion: full, 5/5 IR, full, lift-off: 5/5 ER at neutral: full, 5/5 AC crossover: neg Neer: pos Hawkins: pos Drop Test: neg Empty Can: neg Supraspinatus insertion: mild-mod T Bicipital groove: NT Speed's: neg Yergason's: neg Sulcus sign: neg Scapular dyskinesis: none   GEN: alert,appropriate PSYCH: Normally interactive. Cooperative during the interview.   CERVICAL SPINE EXAM Range of motion: Flexion, extension, lateral bending, and rotation: She has  a global approximate 30% loss of motion Pain with terminal motion: Yes Spinous Processes: NT SCM: NT Upper paracervical muscles: Mild Upper traps: NT  Radiology: DG Shoulder Right  Result Date: 09/28/2020 CLINICAL DATA:  Right shoulder pain EXAM: RIGHT SHOULDER - 2+ VIEW COMPARISON:  None. FINDINGS: There is no evidence of fracture or dislocation. There is no evidence of arthropathy or other focal bone abnormality. Soft tissues are unremarkable. IMPRESSION: Negative. Electronically Signed   By: Fidela Salisbury [name]   On: 09/28/2020 21:57     Assessment and Plan:     ICD-10-CM   1. Lateral epicondylitis of right elbow  M77.11     2. Acute neck pain  M54.2 CANCELED: DG Cervical Spine Complete    3. Acute pain of right shoulder  M25.511 DG Shoulder Right    4. Impingement syndrome of right shoulder  M75.41     5. Cervical radiculopathy, acute  M54.12      Multiple overuse issues with acute lateral epicondylitis being the primary pain driver.  She also has some rotator cuff tendinitis, acute right-sided cervical radiculopathy, and what also appears to be some carpal tunnel syndrome.  I did have her start doing some basic motion and gave her a 14-day course of prednisone.  She does have regular follow-up with neurosurgery, and if her neck symptoms do not improve then we will likely get him involved.  Meds ordered this encounter  Medications   predniSONE (DELTASONE) 20 MG tablet    Sig: 2 tabs po for 7 days, then 1 tab po for 7 days    Dispense:  21 tablet    Refill:  0   There are no discontinued medications. Orders Placed This Encounter  Procedures   DG Shoulder Right    Follow-up: Return in about 6 weeks (around 11/08/2020).  Signed,  Maud Deed. Renato Spellman, [name]   Outpatient Encounter Medications as of 09/27/2020  Medication Sig   cyclobenzaprine (FLEXERIL) 10 MG tablet TAKE 0.5-1 TABLETS (5-10 MG TOTAL) BY MOUTH AT BEDTIME.   diazepam (VALIUM) 5 MG tablet take one tablet po  40 minutes prior to procedure. May repeat if needed after one hour   escitalopram (LEXAPRO) 10 MG tablet TAKE 1 TABLET BY MOUTH EVERY DAY   Multiple Vitamins-Minerals (MULTIVITAMIN WITH MINERALS) tablet Take 1 tablet by mouth daily.   predniSONE (DELTASONE) 20 MG tablet 2 tabs po for 7 days, then 1 tab po for 7 days   No facility-administered encounter medications on file as of 09/27/2020.

## 2020-09-27 NOTE — Patient Instructions (Signed)
Tennis elbow brace

## 2020-10-17 DIAGNOSIS — M47892 Other spondylosis, cervical region: Secondary | ICD-10-CM | POA: Diagnosis not present

## 2020-10-17 DIAGNOSIS — M47812 Spondylosis without myelopathy or radiculopathy, cervical region: Secondary | ICD-10-CM | POA: Diagnosis not present

## 2020-10-25 DIAGNOSIS — M545 Low back pain, unspecified: Secondary | ICD-10-CM | POA: Diagnosis not present

## 2020-10-25 DIAGNOSIS — M546 Pain in thoracic spine: Secondary | ICD-10-CM | POA: Diagnosis not present

## 2020-10-31 ENCOUNTER — Other Ambulatory Visit: Payer: Self-pay | Admitting: Family Medicine

## 2020-11-01 NOTE — Telephone Encounter (Signed)
Last OV with PCP was on 08/25/20 to discuss Hep C testing. Last filled on 08/07/20 #30 tabs with 1 refill

## 2020-11-27 DIAGNOSIS — M1612 Unilateral primary osteoarthritis, left hip: Secondary | ICD-10-CM | POA: Diagnosis not present

## 2020-11-28 DIAGNOSIS — M47892 Other spondylosis, cervical region: Secondary | ICD-10-CM | POA: Diagnosis not present

## 2020-11-28 DIAGNOSIS — Z981 Arthrodesis status: Secondary | ICD-10-CM | POA: Diagnosis not present

## 2021-10-10 ENCOUNTER — Telehealth: Payer: Self-pay | Admitting: Family Medicine

## 2021-10-10 NOTE — Telephone Encounter (Signed)
Forms are in Dr tower in [employer] on her desk

## 2021-10-10 NOTE — Telephone Encounter (Signed)
Patient significant other came in to drop off medical evaluation paperwork and has been placed in PCP folder.

## 2021-10-18 ENCOUNTER — Encounter: Payer: Self-pay | Admitting: Family Medicine

## 2021-10-19 ENCOUNTER — Ambulatory Visit (INDEPENDENT_AMBULATORY_CARE_PROVIDER_SITE_OTHER): Payer: Self-pay | Admitting: Family Medicine

## 2021-10-19 ENCOUNTER — Encounter: Payer: Self-pay | Admitting: Family Medicine

## 2021-10-19 VITALS — BP 106/74 | HR 90 | Ht 66.0 in | Wt 144.0 lb

## 2021-10-19 DIAGNOSIS — Z0289 Encounter for other administrative examinations: Secondary | ICD-10-CM

## 2021-10-19 DIAGNOSIS — Z205 Contact with and (suspected) exposure to viral hepatitis: Secondary | ICD-10-CM

## 2021-10-19 DIAGNOSIS — R21 Rash and other nonspecific skin eruption: Secondary | ICD-10-CM | POA: Insufficient documentation

## 2021-10-19 NOTE — Assessment & Plan Note (Signed)
Below knees Happened with weed eating No itch No tick bite Consistent with contact derm  inst to keep clean and dry  Avoid shaving until resolved Update if not starting to improve in a week or if worsening

## 2021-10-19 NOTE — Patient Instructions (Addendum)
Hep C screen today   No restrictions for foster care   Take care of yourself

## 2021-10-19 NOTE — Assessment & Plan Note (Signed)
46 y old foster child She uses gloves/precautions   Hep C screen today

## 2021-10-19 NOTE — Assessment & Plan Note (Signed)
No communicable diseases  Healthy without limitations   Forms done Nl exam  Good candidate for foster care and adoption in the future

## 2021-10-19 NOTE — Progress Notes (Signed)
Subjective:    Patient ID: [name], female    DOB: 25-Jun-1970, 51 y.o.   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    Needs form filled out to be a foster care provider  Has done this for years  Still has 2  The same - 25, 50 years old  The 50 year old has hep C and will be starting treatment for that  Wants to keep screening for hep C Uses gloves with diaper change any body fluid contact   Unsure if she will have them permanently  She would like to adopt them    No history of TB or exposure to  Doing well mental health wise  No more stress than usual   Occ migraine  No more than otc medicine as needed /doing well   She takes magnesium/vitamins    BP Readings from Last 3 Encounters:  10/19/21 106/74  09/27/20 112/72  08/25/20 126/84   Pulse Readings from Last 3 Encounters:  10/19/21 90  09/27/20 87  08/25/20 86    Patient Active Problem List   Diagnosis Date Noted   Exposure to hepatitis C 08/25/2020   Paresthesia of both hands 04/27/2020   Fatigue 04/27/2020   Joint pain 04/27/2020   Stress reaction 10/22/2019   Heartburn 04/12/2019   Hip dysplasia, acquired, left 12/15/2017   Hip pain 04/22/2016   Cervical pain (neck) 11/08/2015   Routine general medical examination at a health care facility 05/13/2015   Pre-adoption visit for adoptive parent 05/09/2014   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   Migraine without aura 01/13/2007   Past Medical History:  Diagnosis Date   Kidney stones    Migraine    chronic, daily   Temporomandibular joint disorders, unspecified    Past Surgical History:  Procedure Laterality Date   ABDOMINAL HYSTERECTOMY     KNEE CARTILAGE SURGERY  1988   torn cartilage   NECK  SURGERY     PARTIAL HYSTERECTOMY  1/07   Endometriosis; ovarian cyst (1/2 ovary on left)   TUBAL LIGATION     Social History   Tobacco Use   Smoking status: Never   Smokeless tobacco: Never  Substance Use Topics   Alcohol use: No    Alcohol/week: 0.0 standard drinks of alcohol   Drug use: No   Family History  Problem Relation Age of Onset   Diabetes Father    Color blindness Father    Hypertension Mother    Diabetes Other        GM   Ovarian cancer Paternal Grandmother    Cataracts Paternal Grandmother    Pancreatitis Paternal Grandfather    Pancreatic cancer Paternal Grandfather    Cataracts Other        Aunt   Cataracts Son        childhood   Hypertension Sister    Melanoma Other        grandparent   Migraines Other        uncle   Esophageal cancer Maternal Grandmother    Colon cancer Neg Hx    Rectal cancer Neg Hx    Stomach cancer Neg Hx  Allergies  Allergen Reactions   Buspar [Buspirone]     Nausea    Morphine Sulfate Itching    Itching and redness of skin   Current Outpatient Medications on File Prior to Visit  Medication Sig Dispense Refill   diazepam (VALIUM) 5 MG tablet take one tablet po 40 minutes prior to procedure. May repeat if needed after one hour     No current facility-administered medications on file prior to visit.     Review of Systems  Constitutional:  Negative for activity change, appetite change, fatigue, fever and unexpected weight change.  HENT:  Negative for congestion, ear pain, rhinorrhea, sinus pressure and sore throat.   Eyes:  Negative for pain, redness and visual disturbance.  Respiratory:  Negative for cough, shortness of breath and wheezing.   Cardiovascular:  Negative for chest pain and palpitations.  Gastrointestinal:  Negative for abdominal pain, blood in stool, constipation and diarrhea.  Endocrine: Negative for polydipsia and polyuria.  Genitourinary:  Negative for dysuria, frequency and urgency.   Musculoskeletal:  Negative for arthralgias, back pain and myalgias.  Skin:  Negative for pallor and rash.  Allergic/Immunologic: Negative for environmental allergies.  Neurological:  Positive for headaches. Negative for dizziness and syncope.       Migraines are less often Can treat otc Magnesium helps  Hematological:  Negative for adenopathy. Does not bruise/bleed easily.  Psychiatric/Behavioral:  Negative for decreased concentration and dysphoric mood. The patient is not nervous/anxious.        Objective:   Physical Exam Constitutional:      General: She is not in acute distress.    Appearance: Normal appearance. She is well-developed and normal weight. She is not ill-appearing or diaphoretic.  HENT:     Head: Normocephalic and atraumatic.  Eyes:     Conjunctiva/sclera: Conjunctivae normal.     Pupils: Pupils are equal, round, and reactive to light.  Neck:     Thyroid: No thyromegaly.     Vascular: No carotid bruit or JVD.  Cardiovascular:     Rate and Rhythm: Normal rate and regular rhythm.     Heart sounds: Normal heart sounds.     No gallop.  Pulmonary:     Effort: Pulmonary effort is normal. No respiratory distress.     Breath sounds: Normal breath sounds. No wheezing or rales.  Abdominal:     General: There is no distension or abdominal bruit.     Palpations: Abdomen is soft. There is no mass.     Tenderness: There is no abdominal tenderness.     Comments: No HSM  Musculoskeletal:     Cervical back: Normal range of motion and neck supple.     Right lower leg: No edema.     Left lower leg: No edema.  Lymphadenopathy:     Cervical: No cervical adenopathy.  Skin:    General: Skin is warm and dry.     Coloration: Skin is not jaundiced or pale.     Findings: No rash.     Comments: Rash on legs consistent with contact derm Below the knee  Small areas of erythema with some scale No excoriation  Neurological:     Mental Status: She is alert.     Coordination:  Coordination normal.     Deep Tendon Reflexes: Reflexes are normal and symmetric. Reflexes normal.  Psychiatric:        Mood and Affect: Mood normal.           Assessment &  Plan:   Problem List Items Addressed This Visit       Musculoskeletal and Integument   Rash    Below knees Happened with weed eating No itch No tick bite Consistent with contact derm  inst to keep clean and dry  Avoid shaving until resolved Update if not starting to improve in a week or if worsening          Other   Encounter for physical examination of prospective foster parent - Primary    No communicable diseases  Healthy without limitations   Forms done Nl exam  Good candidate for foster care and adoption in the future       Exposure to hepatitis C    63 y old foster child She uses gloves/precautions   Hep C screen today      Relevant Orders   Hepatitis C Antibody

## 2021-10-22 LAB — HEPATITIS C ANTIBODY: Hepatitis C Ab: NONREACTIVE

## 2021-12-17 ENCOUNTER — Encounter: Payer: Self-pay | Admitting: Family Medicine

## 2021-12-27 DIAGNOSIS — F411 Generalized anxiety disorder: Secondary | ICD-10-CM | POA: Diagnosis not present

## 2021-12-29 IMAGING — DX DG SHOULDER 2+V*R*
3 series · 3 of 3 positions shown · non-contrast
Comparison: None.

CLINICAL DATA: Right shoulder pain

EXAM:
RIGHT SHOULDER - 2+ VIEW

[shoulder axial]
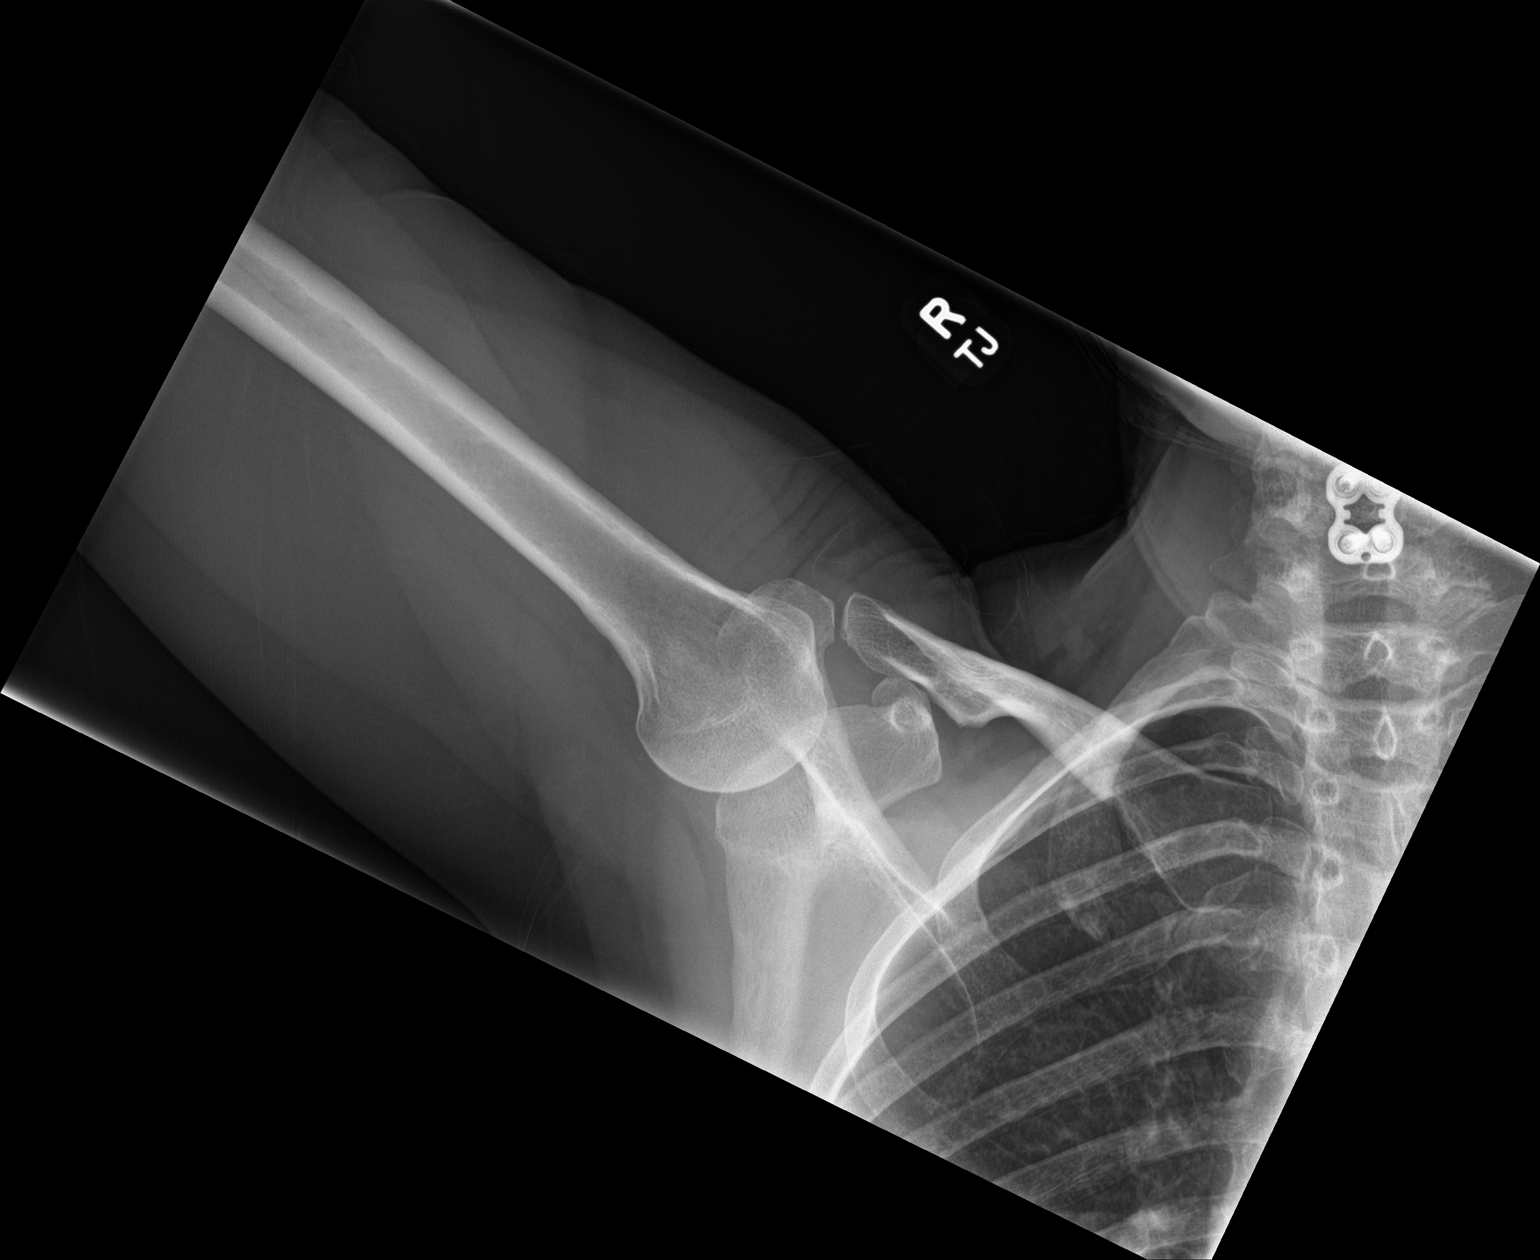

[shoulder obl]
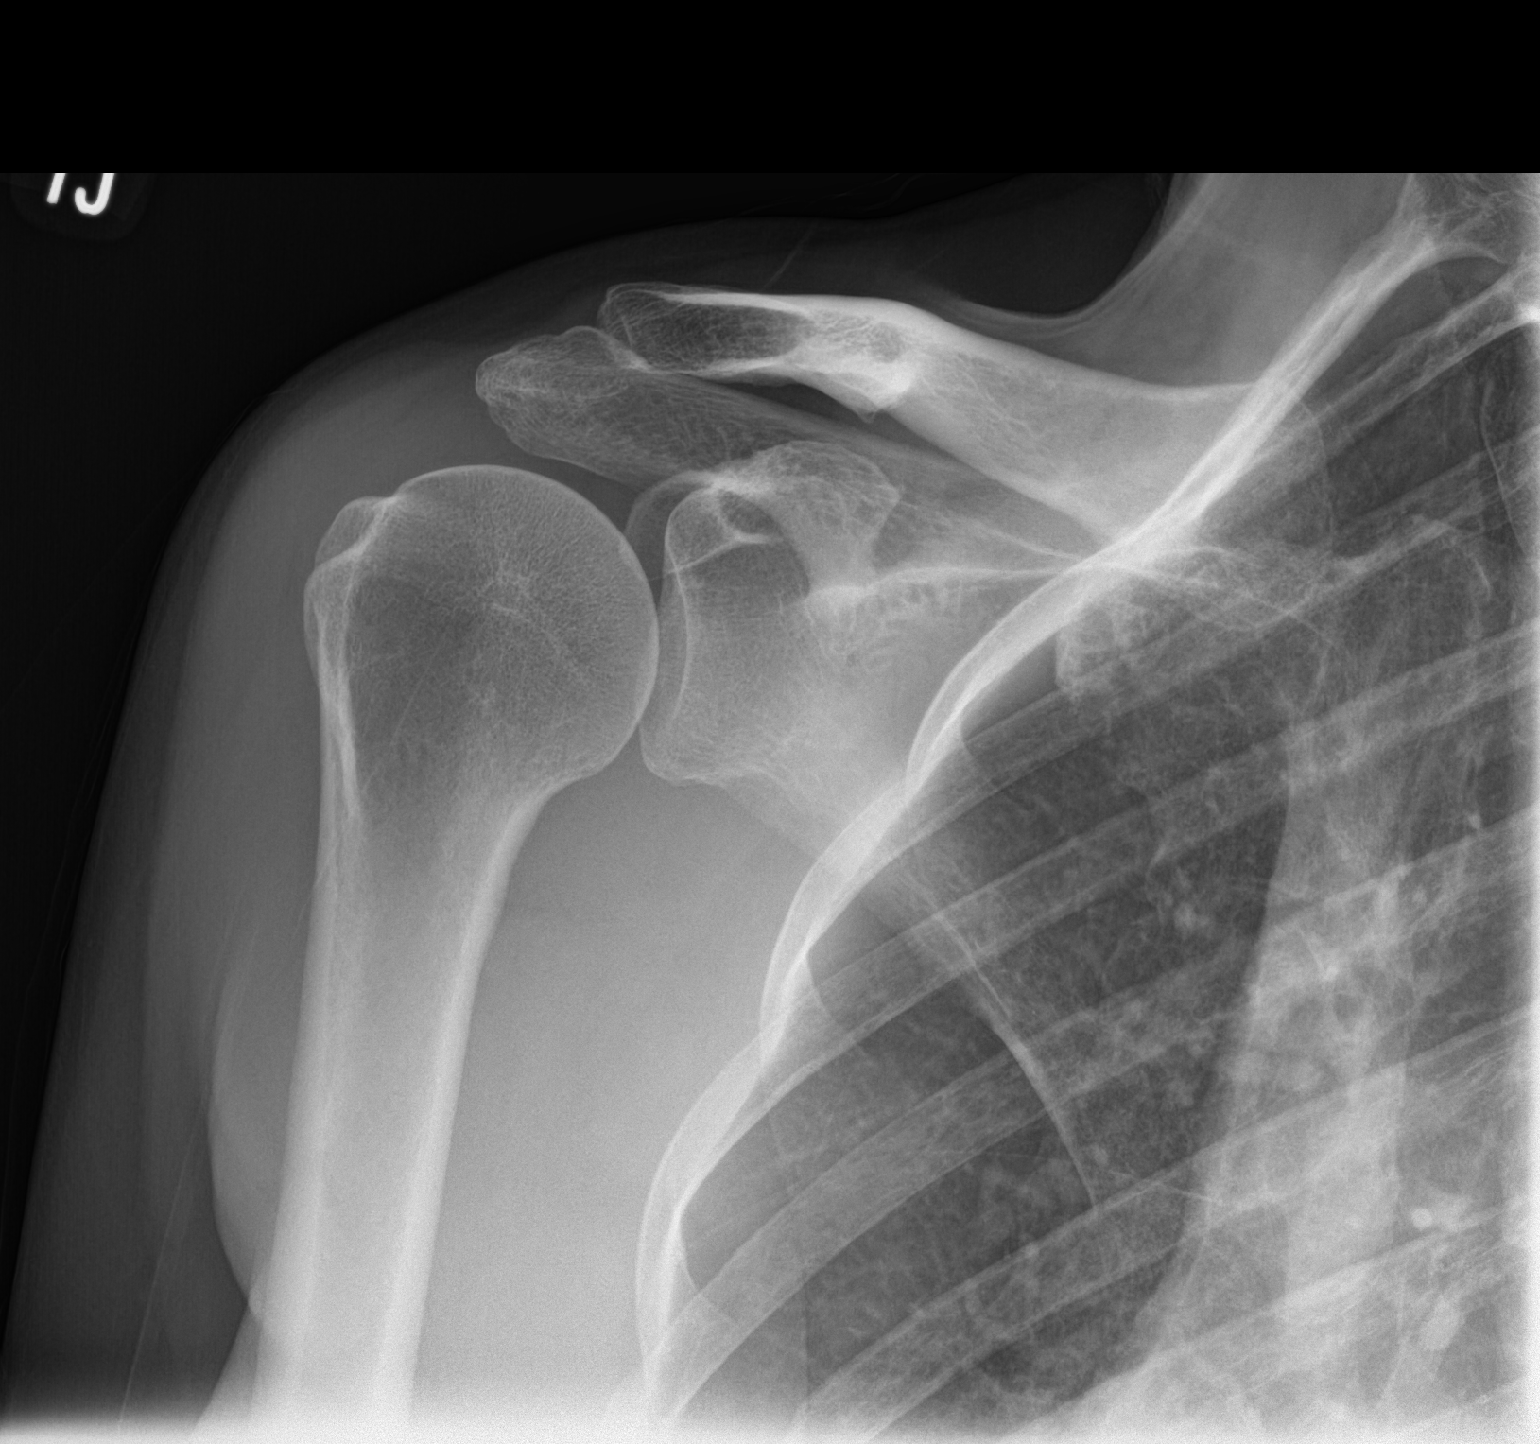

[shoulder y-view]
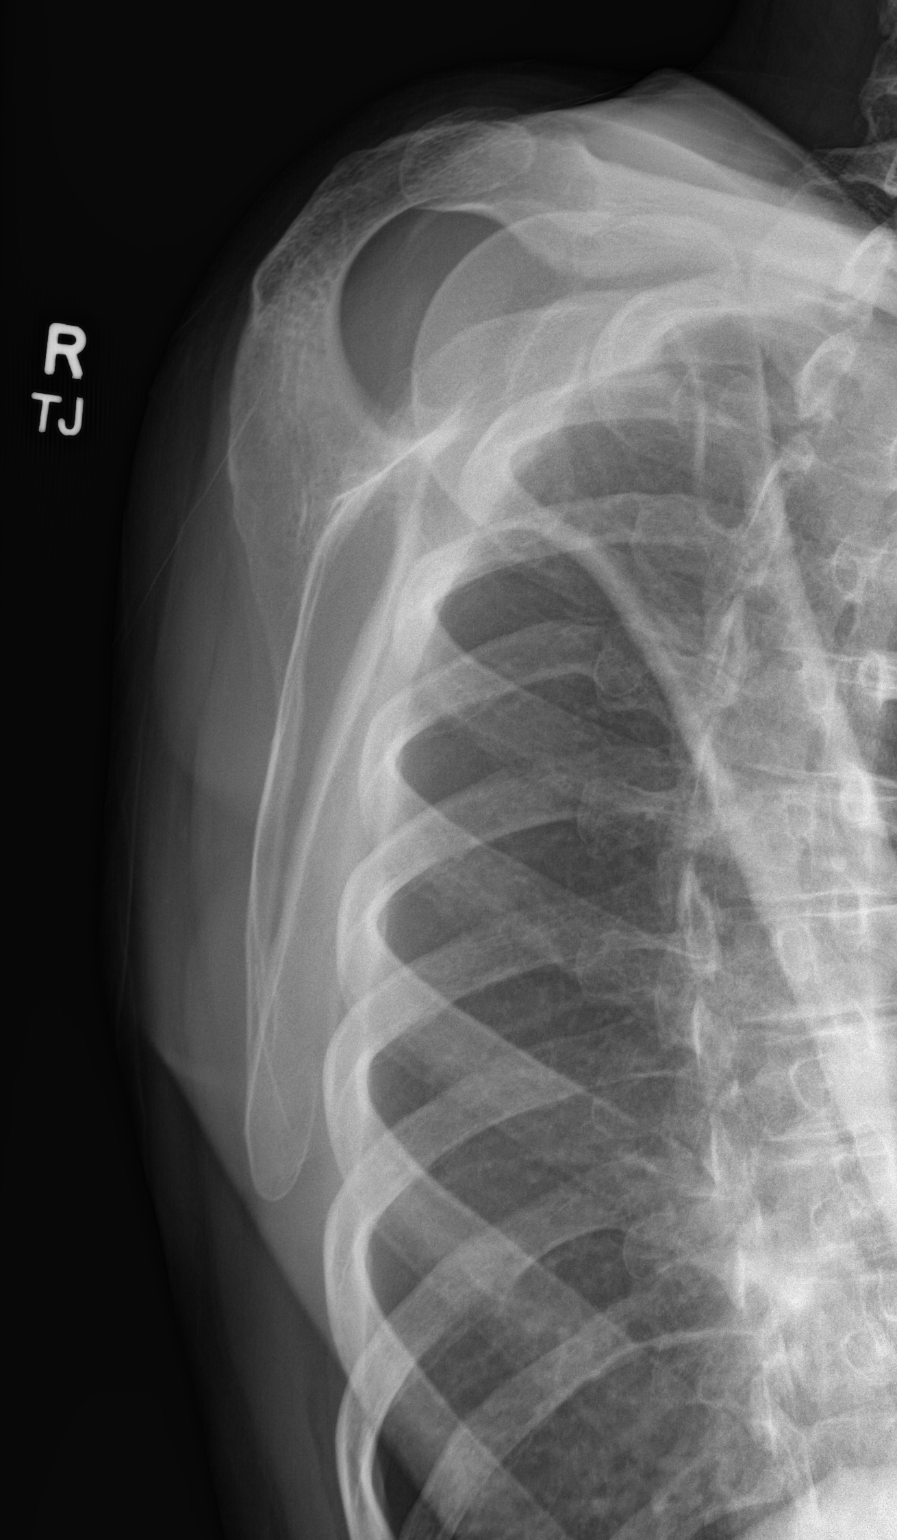

[3 of 3 positions shown; findings below may reference images not displayed]

FINDINGS: There is no evidence of fracture or dislocation. There is no
evidence of arthropathy or other focal bone abnormality. Soft
tissues are unremarkable.
IMPRESSION: Negative.

## 2022-01-10 DIAGNOSIS — F411 Generalized anxiety disorder: Secondary | ICD-10-CM | POA: Diagnosis not present

## 2022-01-22 DIAGNOSIS — F411 Generalized anxiety disorder: Secondary | ICD-10-CM | POA: Diagnosis not present

## 2022-02-07 DIAGNOSIS — F411 Generalized anxiety disorder: Secondary | ICD-10-CM | POA: Diagnosis not present

## 2022-02-13 DIAGNOSIS — F411 Generalized anxiety disorder: Secondary | ICD-10-CM | POA: Diagnosis not present

## 2022-03-05 DIAGNOSIS — F411 Generalized anxiety disorder: Secondary | ICD-10-CM | POA: Diagnosis not present

## 2022-03-15 ENCOUNTER — Encounter: Payer: Self-pay | Admitting: Family Medicine

## 2022-03-21 DIAGNOSIS — F411 Generalized anxiety disorder: Secondary | ICD-10-CM | POA: Diagnosis not present

## 2022-03-25 ENCOUNTER — Ambulatory Visit (INDEPENDENT_AMBULATORY_CARE_PROVIDER_SITE_OTHER): Payer: Federal, State, Local not specified - PPO | Admitting: Family Medicine

## 2022-03-25 ENCOUNTER — Encounter: Payer: Self-pay | Admitting: Family Medicine

## 2022-03-25 VITALS — BP 108/70 | HR 81 | Temp 98.0°F | Ht 65.0 in | Wt 149.0 lb

## 2022-03-25 DIAGNOSIS — Z0000 Encounter for general adult medical examination without abnormal findings: Secondary | ICD-10-CM

## 2022-03-25 DIAGNOSIS — F430 Acute stress reaction: Secondary | ICD-10-CM

## 2022-03-25 DIAGNOSIS — G43009 Migraine without aura, not intractable, without status migrainosus: Secondary | ICD-10-CM | POA: Diagnosis not present

## 2022-03-25 MED ORDER — ESCITALOPRAM OXALATE 10 MG PO TABS: 10.0000 mg | ORAL_TABLET | Freq: Every day | ORAL | 1 refills | Status: DC

## 2022-03-25 NOTE — Assessment & Plan Note (Signed)
After having to give up her 50 yo foster child / somewhat like grief  Seeing a counselor Reviewed stressors/ coping techniques/symptoms/ support sources/ tx options and side effects in detail today Disc opt for medicine, has used lexapro with success and good tolerance in the past (both dep and anx sympt) Discussed expectations of SSRI medication including time to effectiveness and mechanism of action, also poss of side effects (early and late)- including mental fuzziness, weight or appetite change, nausea and poss of worse dep or anxiety (even suicidal thoughts)  Pt voiced understanding and will stop med and update if this occurs   Will f/u in a month after starting lexapro 10  Enc self care Exercise/ outdoor time are good with job as well

## 2022-03-25 NOTE — Assessment & Plan Note (Signed)
Reviewed health habits including diet and exercise and skin cancer prevention Reviewed appropriate screening tests for age  Also reviewed health mt list, fam hx and immunization status , as well as social and family history   See HPI Labs ordered  Disc mental health in detail  Sent for last mamm and pap report from gyn (both from 06/2021)  Decliens flu shot  Disc shingrix vaccine and enc to check on ins coverage  Colonoscpy 2021 with 10 y recall  Enc good self care

## 2022-03-25 NOTE — Assessment & Plan Note (Signed)
Pt is struggling  Interested in non injection opt for tx (has had trigger pt and monthly inj in the past) Would like ref to new clinic ([facility])   Ref done to neuro  Disc lifestyle habits New stressors no doubt worsening condition

## 2022-03-25 NOTE — Progress Notes (Signed)
Subjective:    Patient ID: [name], female    DOB: [birth year], 51 y.o.   MRN: [id number]  HPI Here for health maintenance exam and to review chronic medical problems   Stress reaction also   Wt Readings from Last 3 Encounters:  03/25/22 149 lb (67.6 kg)  10/19/21 144 lb (65.3 kg)  09/27/20 151 lb 4 oz (68.6 kg)   24.79 kg/m  Inc stress -having a tough time After 3 years of having a foster baby- a grand parent took it back  She cannot have contact , the family will not respond to her requests  Seeing a therapist  (has an appt today) Every 2-3 weeks  Helps some   Her step mom died recently from leukemia and stroke , this was hard as well   Took lexapro in the past  In the past nausea from buspar -does not want that   Her grand children are in [place] care of herself   Does not eat like she should  Tries to eat healthy /just does not always eat regularly   Fighting some weight gain   Headaches have gotten worse     Immunization History  Administered Date(s) Administered   PFIZER(Purple Top)SARS-COV-2 Vaccination 02/13/2020   Td 04/08/2005   Tdap 05/22/2015   Health Maintenance Due  Topic Date Due   HIV Screening  Never done   MAMMOGRAM  07/01/2017   PAP SMEAR-Modifier  07/02/2019   Zoster Vaccines- Shingrix (1 of 2) Never done   INFLUENZA VACCINE  Never done   COVID-19 Vaccine (2 - 2023-24 season) 12/07/2021  Shingrix vaccine   Flu shot - declines   Mammogram 3/201-- march 2023  Self breast exam  Pap 06/2016  [facility]  06/20[facility]  Last visit was march there and gets pap every year   Colonoscopy 05/2019 -10 year recall    Migraine headaches are worse  [facility] - but she does not want shots   Patient Active Problem List   Diagnosis Date Noted   Exposure to hepatitis C 08/25/2020   Paresthesia of both hands 04/27/2020   Fatigue 04/27/2020   Joint pain 04/27/2020   Stress reaction  10/22/2019   Heartburn 04/12/2019   Hip dysplasia, acquired, left 12/15/2017   Routine general medical examination at a health care facility 05/13/2015   Encounter for physical examination of prospective foster parent 05/09/2014   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   Migraine without aura 01/13/2007   Past Medical History:  Diagnosis Date   Kidney stones    Migraine    chronic, daily   Temporomandibular joint disorders, unspecified    Past Surgical History:  Procedure Laterality Date   ABDOMINAL HYSTERECTOMY     KNEE CARTILAGE SURGERY  1988   torn cartilage   NECK SURGERY     PARTIAL HYSTERECTOMY  1/07   Endometriosis; ovarian cyst (1/2 ovary on left)   TUBAL LIGATION     Social History   Tobacco Use   Smoking status: Never   Smokeless tobacco: Never  Substance Use Topics   Alcohol use: No    Alcohol/week: 0.0 standard drinks of alcohol   Drug use: No   Family History  Problem Relation Age of Onset   Diabetes Father    Color blindness Father    Hypertension Mother    Diabetes Other        GM  Ovarian cancer Paternal Grandmother    Cataracts Paternal Grandmother    Pancreatitis Paternal Grandfather    Pancreatic cancer Paternal Grandfather    Cataracts Other        Aunt   Cataracts Son        childhood   Hypertension Sister    Melanoma Other        grandparent   Migraines Other        uncle   Esophageal cancer Maternal Grandmother    Colon cancer Neg Hx    Rectal cancer Neg Hx    Stomach cancer Neg Hx    Allergies  Allergen Reactions   Buspar [Buspirone]     Nausea    Morphine Sulfate Itching    Itching and redness of skin   No current outpatient medications on file prior to visit.   No current facility-administered medications on file prior to visit.     Review of Systems  Constitutional:  Positive for appetite change and fatigue. Negative for activity change, fever and unexpected weight change.  HENT:  Negative for congestion, ear  pain, rhinorrhea, sinus pressure and sore throat.   Eyes:  Negative for pain, redness and visual disturbance.  Respiratory:  Negative for cough, shortness of breath and wheezing.   Cardiovascular:  Negative for chest pain and palpitations.  Gastrointestinal:  Negative for abdominal pain, blood in stool, constipation and diarrhea.  Endocrine: Negative for polydipsia and polyuria.  Genitourinary:  Negative for dysuria, frequency and urgency.  Musculoskeletal:  Negative for arthralgias, back pain and myalgias.  Skin:  Negative for pallor and rash.  Allergic/Immunologic: Negative for environmental allergies.  Neurological:  Positive for headaches. Negative for dizziness, syncope and facial asymmetry.  Hematological:  Negative for adenopathy. Does not bruise/bleed easily.  Psychiatric/Behavioral:  Positive for dysphoric mood and sleep disturbance. Negative for suicidal ideas. The patient is nervous/anxious.        Objective:   Physical Exam Constitutional:      General: She is not in acute distress.    Appearance: Normal appearance. She is well-developed and normal weight. She is not ill-appearing or diaphoretic.  HENT:     Head: Normocephalic and atraumatic.     Right Ear: Tympanic membrane, ear canal and external ear normal.     Left Ear: Tympanic membrane, ear canal and external ear normal.     Nose: Nose normal. No congestion.     Mouth/Throat:     Mouth: Mucous membranes are moist.     Pharynx: Oropharynx is clear. No posterior oropharyngeal erythema.  Eyes:     General: No scleral icterus.    Extraocular Movements: Extraocular movements intact.     Conjunctiva/sclera: Conjunctivae normal.     Pupils: Pupils are equal, round, and reactive to light.  Neck:     Thyroid: No thyromegaly.     Vascular: No carotid bruit or JVD.  Cardiovascular:     Rate and Rhythm: Normal rate and regular rhythm.     Pulses: Normal pulses.     Heart sounds: Normal heart sounds.     No gallop.   Pulmonary:     Effort: Pulmonary effort is normal. No respiratory distress.     Breath sounds: Normal breath sounds. No wheezing.     Comments: Good air exch Chest:     Chest wall: No tenderness.  Abdominal:     General: Bowel sounds are normal. There is no distension or abdominal bruit.     Palpations: Abdomen is soft.  There is no mass.     Tenderness: There is no abdominal tenderness.     Hernia: No hernia is present.  Genitourinary:    Comments: Breast and pelvic exam are done by gyn provider Musculoskeletal:        General: No tenderness. Normal range of motion.     Cervical back: Normal range of motion and neck supple. No rigidity. No muscular tenderness.     Right lower leg: No edema.     Left lower leg: No edema.     Comments: No kyphosis   Lymphadenopathy:     Cervical: No cervical adenopathy.  Skin:    General: Skin is warm and dry.     Coloration: Skin is not pale.     Findings: No erythema or rash.  Neurological:     Mental Status: She is alert. Mental status is at baseline.     Cranial Nerves: No cranial nerve deficit.     Motor: No abnormal muscle tone.     Coordination: Coordination normal.     Gait: Gait normal.     Deep Tendon Reflexes: Reflexes are normal and symmetric. Reflexes normal.  Psychiatric:        Attention and Perception: Attention normal.        Mood and Affect: Mood is anxious and depressed. Affect is blunt.        Speech: Speech normal.        Behavior: Behavior normal.        Cognition and Memory: Cognition and memory normal.     Comments: Candidly discusses symptoms and stressors             Assessment & Plan:   Problem List Items Addressed This Visit       Cardiovascular and Mediastinum   Migraine without aura    Pt is struggling  Interested in non injection opt for tx (has had trigger pt and monthly inj in the past) Would like ref to new clinic ([facility])   Ref done to neuro   Disc lifestyle habits New stressors no doubt worsening condition       Relevant Medications   escitalopram (LEXAPRO) 10 MG tablet   Other Relevant Orders   Ambulatory referral to Neurology     Other   Routine general medical examination at a health care facility - Primary    Reviewed health habits including diet and exercise and skin cancer prevention Reviewed appropriate screening tests for age  Also reviewed health mt list, fam hx and immunization status , as well as social and family history   See HPI Labs ordered  Disc mental health in detail  Sent for last mamm and pap report from gyn (both from 06/2021)  Decliens flu shot  Disc shingrix vaccine and enc to check on ins coverage  Colonoscpy 2021 with 10 y recall  Enc good self care        Relevant Orders   TSH   Lipid panel   Comprehensive metabolic panel   CBC with Differential/Platelet   Stress reaction    After having to give up her 59 yo foster child / somewhat like grief  Seeing a counselor Reviewed stressors/ coping techniques/symptoms/ support sources/ tx options and side effects in detail today Disc opt for medicine, has used lexapro with success and good tolerance in the past (both dep and anx sympt) Discussed expectations of SSRI medication including time to effectiveness and mechanism of  action, also poss of side effects (early and late)- including mental fuzziness, weight or appetite change, nausea and poss of worse dep or anxiety (even suicidal thoughts)  Pt voiced understanding and will stop med and update if this occurs   Will f/u in a month after starting lexapro 10  Enc self care Exercise/ outdoor time are good with job as well      Relevant Medications   escitalopram (LEXAPRO) 10 MG tablet

## 2022-03-25 NOTE — Patient Instructions (Addendum)
Stop at check out and sign a release for the pap and mammogram from [facility]   If you are interested in the shingles vaccine series (Shingrix), call your insurance or pharmacy to check on coverage and location it must be given.  If affordable - you can schedule it here or at your pharmacy depending on coverage   Start lexapro '10mg'$  daily  If you feel more anxious or more depressed , stop it and let us know  You have taken this in the past and did well so I don't expect any problems   Follow up here in a month and decide if we want to increase the dose   Keep going to your counselor, that is more helpful than medication  I placed a referral to neurology for headaches , if you don't hear from someone in 1-2 weeks please let us know

## 2022-03-26 LAB — CBC WITH DIFFERENTIAL/PLATELET
Basophils Absolute: 0 10*3/uL (ref 0.0–0.1)
Basophils Relative: 0.7 % (ref 0.0–3.0)
Eosinophils Absolute: 0 10*3/uL (ref 0.0–0.7)
Eosinophils Relative: 0.7 % (ref 0.0–5.0)
HCT: 37.1 % (ref 36.0–46.0)
Hemoglobin: 13 g/dL (ref 12.0–15.0)
Lymphocytes Relative: 28.8 % (ref 12.0–46.0)
Lymphs Abs: 1.6 10*3/uL (ref 0.7–4.0)
MCHC: 35.1 g/dL (ref 30.0–36.0)
MCV: 90.9 fl (ref 78.0–100.0)
Monocytes Absolute: 0.4 10*3/uL (ref 0.1–1.0)
Monocytes Relative: 6.7 % (ref 3.0–12.0)
Neutro Abs: 3.4 10*3/uL (ref 1.4–7.7)
Neutrophils Relative %: 63.1 % (ref 43.0–77.0)
Platelets: 197 10*3/uL (ref 150.0–400.0)
RBC: 4.08 Mil/uL (ref 3.87–5.11)
RDW: 12.3 % (ref 11.5–15.5)
WBC: 5.4 10*3/uL (ref 4.0–10.5)

## 2022-03-26 LAB — COMPREHENSIVE METABOLIC PANEL
ALT: 28 U/L (ref 0–35)
AST: 42 U/L — ABNORMAL HIGH (ref 0–37)
Albumin: 4.6 g/dL (ref 3.5–5.2)
Alkaline Phosphatase: 57 U/L (ref 39–117)
BUN: 11 mg/dL (ref 6–23)
CO2: 31 mEq/L (ref 19–32)
Calcium: 9.7 mg/dL (ref 8.4–10.5)
Chloride: 101 mEq/L (ref 96–112)
Creatinine, Ser: 0.79 mg/dL (ref 0.40–1.20)
GFR: 86.95 mL/min (ref >60.00)
Glucose, Bld: 87 mg/dL (ref 70–99)
Potassium: 3.7 mEq/L (ref 3.5–5.1)
Sodium: 139 mEq/L (ref 135–145)
Total Bilirubin: 0.6 mg/dL (ref 0.2–1.2)
Total Protein: 7.2 g/dL (ref 6.0–8.3)

## 2022-03-26 LAB — TSH: TSH: 0.75 u[IU]/mL (ref 0.35–5.50)

## 2022-03-26 LAB — LIPID PANEL
Cholesterol: 218 mg/dL — ABNORMAL HIGH (ref 0–200)
HDL: 85.6 mg/dL (ref >39.00)
LDL Cholesterol: 122 mg/dL — ABNORMAL HIGH (ref 0–99)
NonHDL: 132.39
Total CHOL/HDL Ratio: 3
Triglycerides: 53 mg/dL (ref 0.0–149.0)
VLDL: 10.6 mg/dL (ref 0.0–40.0)

## 2022-03-27 DIAGNOSIS — F411 Generalized anxiety disorder: Secondary | ICD-10-CM | POA: Diagnosis not present

## 2022-04-12 ENCOUNTER — Encounter: Payer: Self-pay | Admitting: Family Medicine

## 2022-04-16 DIAGNOSIS — F411 Generalized anxiety disorder: Secondary | ICD-10-CM | POA: Diagnosis not present

## 2022-04-24 ENCOUNTER — Ambulatory Visit: Payer: Federal, State, Local not specified - PPO | Admitting: Family Medicine

## 2022-04-24 ENCOUNTER — Encounter: Payer: Self-pay | Admitting: Family Medicine

## 2022-04-24 VITALS — BP 110/74 | HR 76 | Temp 98.2°F | Resp 16 | Ht 65.0 in | Wt 148.0 lb

## 2022-04-24 DIAGNOSIS — G43009 Migraine without aura, not intractable, without status migrainosus: Secondary | ICD-10-CM

## 2022-04-24 DIAGNOSIS — F430 Acute stress reaction: Secondary | ICD-10-CM

## 2022-04-24 MED ORDER — ESCITALOPRAM OXALATE 20 MG PO TABS: 20.0000 mg | ORAL_TABLET | Freq: Every day | ORAL | 3 refills | Status: DC

## 2022-04-24 NOTE — Patient Instructions (Signed)
Continue counseling   Take care of yourself  You are doing a good job!  Go up on lexapro to 20 mg daily  If any intolerable side effects or if you feel worse hold it and let me know   I hope this will eventually help mood and sleep  May help the headaches also   See the neurologist as planned    Let me know how you are doing in 1-2 months

## 2022-04-24 NOTE — Assessment & Plan Note (Signed)
Pt has a new neurology appt next month  Good lifestyle habits Stress and weather change are triggers   Hoping lexapro may help

## 2022-04-24 NOTE — Progress Notes (Signed)
Subjective:    Patient ID: [name], female    DOB: 01-13-1971, 52 y.o.   MRN: [id number]  HPI Pt presents for f/u of mod/ stress reaction   Wt Readings from Last 3 Encounters:  04/24/22 148 lb (67.1 kg)  03/25/22 149 lb (67.6 kg)  10/19/21 144 lb (65.3 kg)   24.63 kg/m  Vitals:   04/24/22 1535  BP: 110/74  Pulse: 76  Resp: 16  Temp: 98.2 F (36.8 C)  SpO2: 98%     Last visit we disc situational stress after having to give up foster child  Started on lexapro 10 mg   Not a lot of change  Joints hurt some  Feeling more sore after exercise but doubts this is med related   Still sad  Anxious  Angry at times   Not ready to take another child right now   Very busy Works 6 d per week  Person she dates travels/miilitary and she cares for his animals  Alternates weekend visits with him - he is stationed in [place] distance relationship is also hard    No side effects  Does not sleep well  Gets up several times per night   Hard to get comfortable to sleep     Headaches  Has appt at [facility] for this on 2/20     She sees a counselor for talk therapy - still going  Cannot tell if it makes a difference   Patient Active Problem List   Diagnosis Date Noted   Exposure to hepatitis C 08/25/2020   Paresthesia of both hands 04/27/2020   Fatigue 04/27/2020   Joint pain 04/27/2020   Stress reaction 10/22/2019   Heartburn 04/12/2019   Hip dysplasia, acquired, left 12/15/2017   Routine general medical examination at a health care facility 05/13/2015   Encounter for physical examination of prospective foster parent 05/09/2014   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   Migraine without aura 01/13/2007   Past Medical History:  Diagnosis Date   Kidney stones    Migraine    chronic, daily   Temporomandibular joint disorders, unspecified    Past Surgical History:  Procedure Laterality Date   ABDOMINAL HYSTERECTOMY     KNEE CARTILAGE SURGERY  1988    torn cartilage   NECK SURGERY     PARTIAL HYSTERECTOMY  1/07   Endometriosis; ovarian cyst (1/2 ovary on left)   TUBAL LIGATION     Social History   Tobacco Use   Smoking status: Never   Smokeless tobacco: Never  Substance Use Topics   Alcohol use: No    Alcohol/week: 0.0 standard drinks of alcohol   Drug use: No   Family History  Problem Relation Age of Onset   Diabetes Father    Color blindness Father    Hypertension Mother    Diabetes Other        GM   Ovarian cancer Paternal Grandmother    Cataracts Paternal Grandmother    Pancreatitis Paternal Grandfather    Pancreatic cancer Paternal Grandfather    Cataracts Other        Aunt   Cataracts Son        childhood   Hypertension Sister    Melanoma Other        grandparent   Migraines Other        uncle   Esophageal cancer Maternal Grandmother    Colon cancer Neg Hx    Rectal cancer Neg Hx  Stomach cancer Neg Hx    Allergies  Allergen Reactions   Buspar [Buspirone]     Nausea    Morphine Sulfate Itching    Itching and redness of skin   No current outpatient medications on file prior to visit.   No current facility-administered medications on file prior to visit.       Review of Systems  Constitutional:  Positive for fatigue. Negative for activity change, appetite change, fever and unexpected weight change.  HENT:  Negative for congestion, ear pain, rhinorrhea, sinus pressure and sore throat.   Eyes:  Negative for pain, redness and visual disturbance.  Respiratory:  Negative for cough, shortness of breath and wheezing.   Cardiovascular:  Negative for chest pain and palpitations.  Gastrointestinal:  Negative for abdominal pain, blood in stool, constipation and diarrhea.  Endocrine: Negative for polydipsia and polyuria.  Genitourinary:  Negative for dysuria, frequency and urgency.  Musculoskeletal:  Negative for arthralgias, back pain and myalgias.  Skin:  Negative for pallor and rash.   Allergic/Immunologic: Negative for environmental allergies.  Neurological:  Negative for dizziness, syncope and headaches.  Hematological:  Negative for adenopathy. Does not bruise/bleed easily.  Psychiatric/Behavioral:  Positive for dysphoric mood and sleep disturbance. Negative for decreased concentration and suicidal ideas. The patient is nervous/anxious.        Objective:   Physical Exam Constitutional:      General: She is not in acute distress.    Appearance: Normal appearance. She is well-developed and normal weight. She is not ill-appearing or diaphoretic.  HENT:     Head: Normocephalic and atraumatic.  Eyes:     General: No scleral icterus.    Conjunctiva/sclera: Conjunctivae normal.     Pupils: Pupils are equal, round, and reactive to light.  Neck:     Thyroid: No thyromegaly.     Vascular: No carotid bruit or JVD.  Cardiovascular:     Rate and Rhythm: Normal rate and regular rhythm.     Heart sounds: Normal heart sounds.     No gallop.  Pulmonary:     Effort: Pulmonary effort is normal. No respiratory distress.     Breath sounds: Normal breath sounds. No wheezing or rales.  Abdominal:     General: There is no distension or abdominal bruit.     Palpations: Abdomen is soft.  Musculoskeletal:     Cervical back: Normal range of motion and neck supple.     Right lower leg: No edema.     Left lower leg: No edema.  Lymphadenopathy:     Cervical: No cervical adenopathy.  Skin:    General: Skin is warm and dry.     Coloration: Skin is not pale.     Findings: No rash.  Neurological:     Mental Status: She is alert.     Motor: No tremor.     Coordination: Coordination normal.     Deep Tendon Reflexes: Reflexes are normal and symmetric. Reflexes normal.  Psychiatric:        Attention and Perception: Attention normal.        Mood and Affect: Mood normal. Affect is tearful.        Cognition and Memory: Cognition and memory normal.     Comments: Pleasant  Candidly  discusses symptoms and stressors      Mildly tearful            Assessment & Plan:   Problem List Items Addressed This Visit  Cardiovascular and Mediastinum   Migraine without aura    Pt has a new neurology appt next month  Good lifestyle habits Stress and weather change are triggers   Hoping lexapro may help       Relevant Medications   escitalopram (LEXAPRO) 20 MG tablet     Other   Stress reaction - Primary    Stress is ongoing  Not much change in symptoms with lexapro Continues counseling Good self care   Disc opt to inc dose Will go up to 20 mg  Hope this will help sleep and mood more (and poss headaches) Discussed expectations of SSRI medication including time to effectiveness and mechanism of action, also poss of side effects (early and late)- including mental fuzziness, weight or appetite change, nausea and poss of worse dep or anxiety (even suicidal thoughts)  Pt voiced understanding and will stop med and update if this occurs   Inst to check in in 1-2 mo       Relevant Medications   escitalopram (LEXAPRO) 20 MG tablet

## 2022-04-24 NOTE — Assessment & Plan Note (Signed)
Stress is ongoing  Not much change in symptoms with lexapro Continues counseling Good self care   Disc opt to inc dose Will go up to 20 mg  Hope this will help sleep and mood more (and poss headaches) Discussed expectations of SSRI medication including time to effectiveness and mechanism of action, also poss of side effects (early and late)- including mental fuzziness, weight or appetite change, nausea and poss of worse dep or anxiety (even suicidal thoughts)  Pt voiced understanding and will stop med and update if this occurs   Inst to check in in 1-2 mo

## 2022-05-08 ENCOUNTER — Encounter: Payer: Self-pay | Admitting: Dermatology

## 2022-05-08 ENCOUNTER — Ambulatory Visit: Payer: Federal, State, Local not specified - PPO | Admitting: Dermatology

## 2022-05-08 VITALS — BP 116/77 | HR 68

## 2022-05-08 DIAGNOSIS — L821 Other seborrheic keratosis: Secondary | ICD-10-CM | POA: Diagnosis not present

## 2022-05-08 DIAGNOSIS — D229 Melanocytic nevi, unspecified: Secondary | ICD-10-CM

## 2022-05-08 DIAGNOSIS — L578 Other skin changes due to chronic exposure to nonionizing radiation: Secondary | ICD-10-CM

## 2022-05-08 NOTE — Patient Instructions (Addendum)
Seborrheic Keratosis  What causes seborrheic keratoses? Seborrheic keratoses are harmless, common skin growths that first appear during adult life.  As time goes by, more growths appear.  Some people may develop a large number of them.  Seborrheic keratoses appear on both covered and uncovered body parts.  They are not caused by sunlight.  The tendency to develop seborrheic keratoses can be inherited.  They vary in color from skin-colored to gray, brown, or even black.  They can be either smooth or have a rough, warty surface.   Seborrheic keratoses are superficial and look as if they were stuck on the skin.  Under the microscope this type of keratosis looks like layers upon layers of skin.  That is why at times the top layer may seem to fall off, but the rest of the growth remains and re-grows.    Treatment Seborrheic keratoses do not need to be treated, but can easily be removed in the office.  Seborrheic keratoses often cause symptoms when they rub on clothing or jewelry.  Lesions can be in the way of shaving.  If they become inflamed, they can cause itching, soreness, or burning.  Removal of a seborrheic keratosis can be accomplished by freezing, burning, or surgery. If any spot bleeds, scabs, or grows rapidly, please return to have it checked, as these can be an indication of a skin cancer.  Recommend taking Heliocare sun protection supplement daily in sunny weather for additional sun protection. For maximum protection on the sunniest days, you can take up to 2 capsules of regular Heliocare OR take 1 capsule of Heliocare Ultra. For prolonged exposure (such as a full day in the sun), you can repeat your dose of the supplement 4 hours after your first dose. Heliocare can be purchased at [employer], at some [facility] or at [email or web address].    Melanoma ABCDEs  Melanoma is the most dangerous type of skin cancer, and is the leading cause of death from skin disease.  You are more likely to  develop melanoma if you: Have light-colored skin, light-colored eyes, or red or blond hair Spend a lot of time in the sun Tan regularly, either outdoors or in a tanning bed Have had blistering sunburns, especially during childhood Have a close family member who has had a melanoma Have atypical moles or large birthmarks  Early detection of melanoma is key since treatment is typically straightforward and cure rates are extremely high if we catch it early.   The first sign of melanoma is often a change in a mole or a new dark spot.  The ABCDE system is a way of remembering the signs of melanoma.  A for asymmetry:  The two halves do not match. B for border:  The edges of the growth are irregular. C for color:  A mixture of colors are present instead of an even brown color. D for diameter:  Melanomas are usually (but not always) greater than 50m - the size of a pencil eraser. E for evolution:  The spot keeps changing in size, shape, and color.  Please check your skin once per month between visits. You can use a small mirror in front and a large mirror behind you to keep an eye on the back side or your body.   If you see any new or changing lesions before your next follow-up, please call to schedule a visit.  Please continue daily skin protection including broad spectrum sunscreen SPF 30+ to sun-exposed areas, reapplying every 2  hours as needed when you're outdoors.    Due to recent changes in healthcare laws, you may see results of your pathology and/or laboratory studies on MyChart before the doctors have had a chance to review them. We understand that in some cases there may be results that are confusing or concerning to you. Please understand that not all results are received at the same time and often the doctors may need to interpret multiple results in order to provide you with the best plan of care or course of treatment. Therefore, we ask that you please give [place] 2 business days to  thoroughly review all your results before contacting the office for clarification. Should we see a critical lab result, you will be contacted sooner.   If You Need Anything After Your Visit  If you have any questions or concerns for your doctor, please call our main line at [phone] and press option 4 to reach your doctor's medical assistant. If no one answers, please leave a voicemail as directed and we will return your call as soon as possible. Messages left after 4 pm will be answered the following business day.   You may also send [place] a message via [place]. We typically respond to MyChart messages within 1-2 business days.  For prescription refills, please ask your pharmacy to contact our office. Our fax number is [phone].  If you have an urgent issue when the clinic is closed that cannot wait until the next business day, you can page your doctor at the number below.    Please note that while we do our best to be available for urgent issues outside of office hours, we are not available 24/7.   If you have an urgent issue and are unable to reach [place], you may choose to seek medical care at your doctor's office, retail clinic, urgent care center, or emergency room.  If you have a medical emergency, please immediately call 911 or go to the emergency department.  Pager Numbers  - Dr. Nehemiah Massed: [phone]  - Dr. Laurence Ferrari: [phone]  - Dr. Nicole Kindred: [phone]  In the event of inclement weather, please call our main line at [phone] for an update on the status of any delays or closures.  Dermatology Medication Tips: Please keep the boxes that topical medications come in in order to help keep track of the instructions about where and how to use these. Pharmacies typically print the medication instructions only on the boxes and not directly on the medication tubes.   If your medication is too expensive, please contact our office at [phone] option 4 or send [place] a message  through [place].   We are unable to tell what your co-pay for medications will be in advance as this is different depending on your insurance coverage. However, we may be able to find a substitute medication at lower cost or fill out paperwork to get insurance to cover a needed medication.   If a prior authorization is required to get your medication covered by your insurance company, please allow [place] 1-2 business days to complete this process.  Drug prices often vary depending on where the prescription is filled and some pharmacies may offer cheaper prices.  The website www.goodrx.com contains coupons for medications through different pharmacies. The prices here do not account for what the cost may be with help from insurance (it may be cheaper with your insurance), but the website can give you the price if you did not use any insurance.  -  print the associated coupon and take it with your prescription to the pharmacy.  - You may also stop by our office during regular business hours and pick up a GoodRx coupon card.  - If you need your prescription sent electronically to a different pharmacy, notify our office through [place] MyChart or by phone at [phone] option 4.     Si Usted Necesita Algo Despus de Su Visita  Tambin puede enviarnos un mensaje a travs de MyChart. Por lo general respondemos a los mensajes de MyChart en el transcurso de 1 a 2 das hbiles.  Para renovar recetas, por favor pida a su farmacia que se ponga en contacto con nuestra oficina. Nuestro nmero de fax es el [phone].  Si tiene un asunto urgente cuando la clnica est cerrada y que no puede esperar hasta el siguiente da hbil, puede llamar/localizar a su doctor(a) al nmero que aparece a continuacin.   Por favor, tenga en cuenta que aunque hacemos todo lo posible para estar disponibles para asuntos urgentes fuera del horario de oficina, no estamos disponibles las 24 horas del da, los 7 das de la  semana.   Si tiene un problema urgente y no puede comunicarse con nosotros, puede optar por buscar atencin mdica  en el consultorio de su doctor(a), en una clnica privada, en un centro de atencin urgente o en una sala de emergencias.  Si tiene una emergencia mdica, por favor llame inmediatamente al 911 o vaya a la sala de emergencias.  Nmeros de bper  - Dr. Kowalski: [phone]  - Dra. Moye: [phone]  - Dra. Stewart: [phone]  En caso de inclemencias del tiempo, por favor llame a nuestra lnea principal al [phone] para una actualizacin sobre el estado de cualquier retraso o cierre.  Consejos para la medicacin en dermatologa: Por favor, guarde las cajas en las que vienen los medicamentos de uso tpico para ayudarle a seguir las instrucciones sobre dnde y cmo usarlos. Las farmacias generalmente imprimen las instrucciones del medicamento slo en las cajas y no directamente en los tubos del medicamento.   Si su medicamento es muy caro, por favor, pngase en contacto con nuestra oficina llamando al [phone] y presione la opcin 4 o envenos un mensaje a travs de MyChart.   No podemos decirle cul ser su copago por los medicamentos por adelantado ya que esto es diferente dependiendo de la cobertura de su seguro. Sin embargo, es posible que podamos encontrar un medicamento sustituto a menor costo o llenar un formulario para que el seguro cubra el medicamento que se considera necesario.   Si se requiere una autorizacin previa para que su compaa de seguros cubra su medicamento, por favor permtanos de 1 a 2 das hbiles para completar este proceso.  Los precios de los medicamentos varan con frecuencia dependiendo del lugar de dnde se surte la receta y alguna farmacias pueden ofrecer precios ms baratos.  El sitio web www.goodrx.com tiene cupones para medicamentos de diferentes farmacias. Los precios aqu no tienen en cuenta lo que podra costar con la ayuda del  seguro (puede ser ms barato con su seguro), pero el sitio web puede darle el precio si no utiliz ningn seguro.  - Puede imprimir el cupn correspondiente y llevarlo con su receta a la farmacia.  - Tambin puede pasar por nuestra oficina durante el horario de atencin regular y recoger una tarjeta de cupones de GoodRx.  - Si necesita que su receta se enve electrnicamente a una farmacia diferente, informe a nuestra   a nuestra oficina a travs de MyChart de [place] o por telfono llamando al [phone] y presione la opcin 4.

## 2022-05-08 NOTE — Progress Notes (Signed)
   New Patient Visit  Subjective  [name] is a 52 y.o. female who presents for the following: Nevus (Patient here today for a spot at left face that she has noticed getting larger. Present for many years. Also with some brown spots at face. No hx skin cancer. ).  Family history of skin cancer - what type(s): unknown - who affected: grandfather   The following portions of the chart were reviewed this encounter and updated as appropriate:   Tobacco  Allergies  Meds  Problems  Med Hx  Surg Hx  Fam Hx      Review of Systems:  No other skin or systemic complaints except as noted in HPI or Assessment and Plan.  Objective  Well appearing patient in no apparent distress; mood and affect are within normal limits.  A focused examination was performed including face, back. Relevant physical exam findings are noted in the Assessment and Plan.    Assessment & Plan   Seborrheic Keratoses - Stuck-on, waxy, tan-brown papules and/or plaques  - Benign-appearing - Discussed benign etiology and prognosis. - Observe - Call for any changes  Melanocytic Nevi - Tan-brown and/or pink-flesh-colored symmetric macules and papules - Benign appearing on exam today - Observation - Call clinic for new or changing moles - Recommend daily use of broad spectrum spf 30+ sunscreen to sun-exposed areas.   Actinic Damage - chronic, secondary to cumulative UV radiation exposure/sun exposure over time - diffuse scaly erythematous macules with underlying dyspigmentation - Recommend daily broad spectrum sunscreen SPF 30+ to sun-exposed areas, reapply every 2 hours as needed.  - Recommend staying in the shade or wearing long sleeves, sun glasses (UVA+UVB protection) and wide brim hats (4-inch brim around the entire circumference of the hat). - Call for new or changing lesions. - Recommend taking Heliocare sun protection supplement daily in sunny weather for additional sun protection. For maximum  protection on the sunniest days, you can take up to 2 capsules of regular Heliocare OR take 1 capsule of Heliocare Ultra. For prolonged exposure (such as a full day in the sun), you can repeat your dose of the supplement 4 hours after your first dose.   Return if symptoms worsen or fail to improve.  Graciella Belton, RMA, am acting as scribe for Forest Gleason, MD .  Documentation: I have reviewed the above documentation for accuracy and completeness, and I agree with the above.  Forest Gleason, MD

## 2022-05-09 DIAGNOSIS — F411 Generalized anxiety disorder: Secondary | ICD-10-CM | POA: Diagnosis not present

## 2022-05-28 ENCOUNTER — Ambulatory Visit: Payer: Federal, State, Local not specified - PPO | Admitting: Psychiatry

## 2022-05-28 ENCOUNTER — Encounter: Payer: Self-pay | Admitting: Psychiatry

## 2022-05-28 VITALS — BP 134/85 | HR 72 | Ht 66.0 in | Wt 153.0 lb

## 2022-05-28 DIAGNOSIS — G43019 Migraine without aura, intractable, without status migrainosus: Secondary | ICD-10-CM | POA: Diagnosis not present

## 2022-05-28 DIAGNOSIS — F411 Generalized anxiety disorder: Secondary | ICD-10-CM | POA: Diagnosis not present

## 2022-05-28 MED ORDER — QULIPTA 10 MG PO TABS: 10.0000 mg | ORAL_TABLET | Freq: Every day | ORAL | 6 refills | Status: DC

## 2022-05-28 MED ORDER — NARATRIPTAN HCL 2.5 MG PO TABS: 2.5000 mg | ORAL_TABLET | ORAL | 6 refills | Status: DC | PRN

## 2022-05-28 NOTE — Patient Instructions (Signed)
Start naratriptan as needed for migraines. Take at the onset of migraine. If headache recurs or does not fully resolve, you may take a second dose after 4 hours.   Start Qulipta 10 mg daily for migraine prevention

## 2022-05-28 NOTE — Progress Notes (Signed)
Referring:  Tower, Wynelle Fanny, MD [address] [place],  [place] [address]  PCP: Glori Bickers, Wynelle Fanny, MD  Neurology was asked to evaluate [name], a 52 year old female for a chief complaint of headaches.  Our recommendations of care will be communicated by shared medical record.    CC:  headaches  History provided from self  HPI:  Medical co-morbidities: kidney stones, TMJ, cervical spondylosis s/p fusion, anxiety  The patient presents for evaluation of headaches which began several years ago. She previously followed at the [facility]. Headaches remitted for a while, but began to return in the last year. She now has headaches nearly every day. They are associated with photophobia, phonophobia, nausea, and allodynia. She will occasionally see spots in her vision. Has a near constant headache, but severe headaches can last 1-3 days at a time.  She has a history of cervical spondylosis s/p fusion and saw NSGY to see if this was causing her worsening migraines. She was told that her spinal stenosis was worse, but not severe enough to warrant surgery at this time. She has been doing physical therapy for her neck pain.  Headache History: Onset: several years ago Triggers: weather changes Aura: spots in vision Location: vertex, occiput Quality/Description: throbbing Associated Symptoms:  Photophobia: yes  Phonophobia: yes  Nausea: yes Worse with activity?: yes Duration of headaches: 1-3 days  Headache days per month: 30 Headache free days per month: 0  Current Treatment: Abortive Excedrin  Preventative none  Prior Therapies                                 Rescue: Flexeril 10 mg PRN Excedrin migraine Maxalt 10 mg PRN  Prevention: Lexapro Amitriptyline Gabapentin Cymbalta nortriptyline Propranolol atenolol Topamax contraindicated due to kidney stones Aimovig 70 mg monthly - pain at injection site Botox - lack of efficacy   LABS: CBC     Component Value Date/Time   WBC 5.4 03/25/2022 1518   RBC 4.08 03/25/2022 1518   HGB 13.0 03/25/2022 1518   HCT 37.1 03/25/2022 1518   PLT 197.0 03/25/2022 1518   MCV 90.9 03/25/2022 1518   MCH 31.2 10/29/2019 1135   MCHC 35.1 03/25/2022 1518   RDW 12.3 03/25/2022 1518   LYMPHSABS 1.6 03/25/2022 1518   MONOABS 0.4 03/25/2022 1518   EOSABS 0.0 03/25/2022 1518   BASOSABS 0.0 03/25/2022 1518      Latest Ref Rng & Units 03/25/2022    3:18 PM 04/27/2020    8:57 AM 10/29/2019   11:35 AM  CMP  Glucose 70 - 99 mg/dL 87  100  103   BUN 6 - 23 mg/dL 11  15  9   $ Creatinine 0.40 - 1.20 mg/dL 0.79  0.83  0.91   Sodium 135 - 145 mEq/L 139  134  138   Potassium 3.5 - 5.1 mEq/L 3.7  3.9  3.8   Chloride 96 - 112 mEq/L 101  96  101   CO2 19 - 32 mEq/L 31  31  27   $ Calcium 8.4 - 10.5 mg/dL 9.7  9.5  9.4   Total Protein 6.0 - 8.3 g/dL 7.2  7.5  7.6   Total Bilirubin 0.2 - 1.2 mg/dL 0.6  1.0  0.9   Alkaline Phos 39 - 117 U/L 57  80  53   AST 0 - 37 U/L 42  27  41  ALT 0 - 35 U/L 28  26  30      $ IMAGING:  MRI brain 01/12/2013: 1. No acute intracranial abnormality.   2. Mild for age nonspecific frontal lobe white matter signal  changes. Differential considerations include accelerated small  vessel ischemia, sequelae of trauma, hypercoagulable state,  vasculitis, migraines, prior infection or demyelination.   MRI C-spine 01/12/2013: C5-C6 predominant degenerative disc disease with left foraminal  stenosis secondary to bulging disk that extends into the left  foramen. This produces moderate left foraminal stenosis that  potentially affects the left C6 nerve.   Imaging independently reviewed on May 28, 2022   Current Outpatient Medications on File Prior to Visit  Medication Sig Dispense Refill   escitalopram (LEXAPRO) 20 MG tablet Take 1 tablet (20 mg total) by mouth daily. 90 tablet 3   No current facility-administered medications on file prior to visit.      Allergies: Allergies  Allergen Reactions   Buspar [Buspirone]     Nausea    Morphine Sulfate Itching    Itching and redness of skin    Family History: Family History  Problem Relation Age of Onset   Diabetes Father    Color blindness Father    Hypertension Mother    Diabetes Other        GM   Ovarian cancer Paternal Grandmother    Cataracts Paternal Grandmother    Pancreatitis Paternal Grandfather    Pancreatic cancer Paternal Grandfather    Cataracts Other        Aunt   Cataracts Son        childhood   Hypertension Sister    Melanoma Other        grandparent   Migraines Other        uncle   Esophageal cancer Maternal Grandmother    Colon cancer Neg Hx    Rectal cancer Neg Hx    Stomach cancer Neg Hx      Past Medical History: Past Medical History:  Diagnosis Date   Kidney stones    Migraine    chronic, daily   Temporomandibular joint disorders, unspecified     Past Surgical History Past Surgical History:  Procedure Laterality Date   ABDOMINAL HYSTERECTOMY     KNEE CARTILAGE SURGERY  1988   torn cartilage   NECK SURGERY     PARTIAL HYSTERECTOMY  1/07   Endometriosis; ovarian cyst (1/2 ovary on left)   TUBAL LIGATION      Social History: Social History   Tobacco Use   Smoking status: Never   Smokeless tobacco: Never  Substance Use Topics   Alcohol use: No    Alcohol/week: 0.0 standard drinks of alcohol   Drug use: No    ROS: Negative for fevers, chills. Positive for headaches. All other systems reviewed and negative unless stated otherwise in HPI.   Physical Exam:   Vital Signs: BP 134/85   Pulse 72   Ht 5' 6"$  (1.676 m)   Wt 153 lb (69.4 kg)   BMI 24.69 kg/m  GENERAL: well appearing,in no acute distress,alert SKIN:  Color, texture, turgor normal. No rashes or lesions HEAD:  Normocephalic/atraumatic. CV:  RRR RESP: Normal respiratory effort MSK: +tenderness to palpation over bilateral occiput, neck, and  shoulders  NEUROLOGICAL: Mental Status: Alert, oriented to person, place and time,Follows commands Cranial Nerves: PERRL, visual fields intact to confrontation, extraocular movements intact, facial sensation intact, no facial droop or ptosis, hearing grossly intact, no dysarthria Motor: muscle strength  5/5 both upper and lower extremities,no drift, normal tone Reflexes: 2+ throughout Sensation: intact to light touch all 4 extremities Coordination: Finger-to- nose-finger intact bilaterally Gait: normal-based   IMPRESSION: 52 year old female with a history of kidney stones, TMJ, cervical spondylosis s/p fusion, anxiety who presents for evaluation of migraines. She has tried multiple medications without improvement. She would prefer to avoid injections or infusions due to needlephobia. Will start Qulipta for migraine prevention. She is concerned about constipation, so will start lowest dose and uptitrate as needed. She is concerned about potential sedating effects of rescue medications. Will start naratriptan for migraine rescue. Could consider gepant in the future if she has sedation with naratriptan.  PLAN: -Prevention: Start Qulipta 10 mg daily -Rescue: Start naratriptan 2.5 mg PRN   I spent a total of 36 minutes chart reviewing and counseling the patient. Headache education was done. Discussed treatment options including preventive and acute medications, and physical therapy. Discussed medication side effects, adverse reactions and drug interactions. Written educational materials and patient instructions outlining all of the above were given.  Follow-up: 7 months   Genia Harold, MD 05/28/2022   4:07 PM

## 2022-06-13 ENCOUNTER — Ambulatory Visit: Payer: Federal, State, Local not specified - PPO | Admitting: Nurse Practitioner

## 2022-06-13 ENCOUNTER — Encounter: Payer: Self-pay | Admitting: Nurse Practitioner

## 2022-06-13 VITALS — BP 130/76 | HR 78 | Temp 98.2°F | Ht 66.0 in | Wt 153.2 lb

## 2022-06-13 DIAGNOSIS — M25562 Pain in left knee: Secondary | ICD-10-CM | POA: Diagnosis not present

## 2022-06-13 MED ORDER — DICLOFENAC SODIUM 1 % EX GEL: 2.0000 g | Freq: Four times a day (QID) | CUTANEOUS | 0 refills | Status: DC

## 2022-06-13 MED ORDER — PREDNISONE 20 MG PO TABS: 20.0000 mg | ORAL_TABLET | Freq: Every day | ORAL | 0 refills | Status: AC

## 2022-06-13 NOTE — Progress Notes (Signed)
Established Patient Office Visit  Subjective:  Patient ID: [name], female    DOB: October 13, 1970  Age: 52 y.o. MRN: [id number]  CC:  Chief Complaint  Patient presents with   Generalized Body Aches    Left knee pain    HPI  [name] presents for left knee pain off and on almost a month. But has been worsen lately. She tried icy hot and hot pack for pain relief.   Denies any fall, twisting motion.  HPI   Past Medical History:  Diagnosis Date   Kidney stones    Migraine    chronic, daily   Temporomandibular joint disorders, unspecified     Past Surgical History:  Procedure Laterality Date   ABDOMINAL HYSTERECTOMY     KNEE CARTILAGE SURGERY  1988   torn cartilage   NECK SURGERY     PARTIAL HYSTERECTOMY  1/07   Endometriosis; ovarian cyst (1/2 ovary on left)   TUBAL LIGATION      Family History  Problem Relation Age of Onset   Diabetes Father    Color blindness Father    Hypertension Mother    Diabetes Other        GM   Ovarian cancer Paternal Grandmother    Cataracts Paternal Grandmother    Pancreatitis Paternal Grandfather    Pancreatic cancer Paternal Grandfather    Cataracts Other        Aunt   Cataracts Son        childhood   Hypertension Sister    Melanoma Other        grandparent   Migraines Other        uncle   Esophageal cancer Maternal Grandmother    Colon cancer Neg Hx    Rectal cancer Neg Hx    Stomach cancer Neg Hx     Social History   Socioeconomic History   Marital status: Married    Spouse name: Not on file   Number of children: 3   Years of education: Not on file   Highest education level: Not on file  Occupational History   Occupation: Buyer, retail: [employer]  Tobacco Use   Smoking status: Never   Smokeless tobacco: Never  Substance and Sexual Activity   Alcohol use: No    Alcohol/week: 0.0 standard drinks of alcohol   Drug use: No   Sexual activity: Not on file  Other Topics Concern   Not on  file  Social History Narrative   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 (Patient not taking: Reported on 06/13/2022) 30 tablet 6   escitalopram (LEXAPRO) 20 MG tablet Take 1 tablet (20 mg total) by mouth daily. 90 tablet 3   naratriptan (AMERGE) 2.5 MG tablet Take 1 tablet (2.5 mg total) by mouth as needed for migraine.  Take one (1) tablet at onset of headache; if returns or does not resolve, may repeat after 4 hours; do not exceed five (5) mg in 24 hours. (Patient not taking: Reported on 06/13/2022) 10 tablet 6   No facility-administered medications prior to visit.    Allergies  Allergen Reactions   Buspar [Buspirone]     Nausea    Morphine Sulfate Itching    Itching and redness of skin    ROS Review of Systems  Constitutional: Negative.   Respiratory: Negative.    Cardiovascular: Negative.   Musculoskeletal:        Knee pain  Skin: Negative.   Neurological: Negative.   Psychiatric/Behavioral: Negative.        Objective:    Physical Exam Constitutional:      Appearance: Normal appearance.  Cardiovascular:     Rate and Rhythm: Normal rate and regular rhythm.     Pulses: Normal pulses.     Heart sounds: Normal heart sounds.  Pulmonary:     Effort: Pulmonary effort is normal.     Breath sounds: Normal breath sounds.  Musculoskeletal:     Comments: Left knee tender to touch.  Neurological:     Mental Status: She is  alert.     BP 130/76   Pulse 78   Temp 98.2 F (36.8 C) (Oral)   Ht 5\' 6"  (1.676 m)   Wt 153 lb 3.2 oz (69.5 kg)   SpO2 97%   BMI 24.73 kg/m  Wt Readings from Last 3 Encounters:  06/13/22 153 lb 3.2 oz (69.5 kg)  05/28/22 153 lb (69.4 kg)  04/24/22 148 lb (67.1 kg)     Health Maintenance  Topic Date Due   HIV Screening  Never done   Zoster Vaccines- Shingrix (1 of 2) Never done   MAMMOGRAM  07/01/2017   PAP SMEAR-Modifier  07/02/2019   COVID-19 Vaccine (2 - Pfizer risk series) 03/05/2020   INFLUENZA VACCINE  07/07/2022 (Originally 11/06/2021)   DTaP/Tdap/Td (3 - Td or Tdap) 05/21/2025   COLONOSCOPY (Pts 45-23yrs Insurance coverage will need to be confirmed)  05/13/2029   Hepatitis C Screening  Completed   HPV VACCINES  Aged Out    There are no preventive care reminders to display for this patient.  Lab Results  Component Value Date   TSH 0.75 03/25/2022   Lab Results  Component Value Date   WBC 5.4 03/25/2022   HGB 13.0 03/25/2022   HCT 37.1 03/25/2022   MCV 90.9 03/25/2022   PLT 197.0 03/25/2022   Lab Results  Component Value Date   NA 139 03/25/2022   K 3.7 03/25/2022   CO2 31 03/25/2022   GLUCOSE 87 03/25/2022   BUN 11 03/25/2022   CREATININE 0.79 03/25/2022   BILITOT 0.6 03/25/2022   ALKPHOS 57 03/25/2022   AST 42 (H) 03/25/2022   ALT 28 03/25/2022   PROT 7.2 03/25/2022   ALBUMIN 4.6 03/25/2022   CALCIUM 9.7 03/25/2022   ANIONGAP 10 10/29/2019   GFR 86.95 03/25/2022   Lab Results  Component Value Date   CHOL 218 (H) 03/25/2022   Lab Results  Component Value Date   HDL 85.60 03/25/2022   Lab Results  Component Value Date   LDLCALC 122 (H) 03/25/2022   Lab Results  Component Value Date   TRIG 53.0 03/25/2022   Lab Results  Component Value Date   CHOLHDL 3 03/25/2022   No results found for: "HGBA1C"    Assessment & Plan:  Left knee pain, unspecified chronicity Assessment & Plan: Rx  prednisone and Voltaren gel sent to the  pharmacy. Advised patient to apply ice.   Other orders -     predniSONE; Take 1 tablet (20 mg total) by mouth daily with breakfast for 5 days.  Dispense: 5 tablet; Refill: 0 -     Diclofenac Sodium; Apply 2 g topically 4 (four) times daily.  Dispense: 50 g; Refill: 0    Follow-up: Return if symptoms worsen or fail to improve.   Theresia Lo, NP

## 2022-06-13 NOTE — Patient Instructions (Signed)
Prescription of prednisone and Voltaren gel sent to [facility]. Apply ice and elevate the leg. If symptoms does not improve please call the office for further evaluation.

## 2022-06-19 DIAGNOSIS — F411 Generalized anxiety disorder: Secondary | ICD-10-CM | POA: Diagnosis not present

## 2022-06-20 DIAGNOSIS — F411 Generalized anxiety disorder: Secondary | ICD-10-CM | POA: Diagnosis not present

## 2022-06-25 ENCOUNTER — Encounter: Payer: Self-pay | Admitting: Nurse Practitioner

## 2022-06-25 DIAGNOSIS — M25562 Pain in left knee: Secondary | ICD-10-CM | POA: Insufficient documentation

## 2022-06-25 NOTE — Assessment & Plan Note (Signed)
Rx  prednisone and Voltaren gel sent to the pharmacy. Advised patient to apply ice.

## 2022-06-26 ENCOUNTER — Telehealth: Payer: Self-pay | Admitting: Pharmacy Technician

## 2022-06-26 NOTE — Telephone Encounter (Signed)
Patient Advocate Encounter  Prior Authorization for Qulipta 10MG  tablets has been approved.    PA# [id number] Key: [facility] Electronic PA Form Effective dates: 06/26/2022 through 12/23/2022      Lyndel Safe, [place] Patient Advocate Specialist [facility] Patient Advocate Team Direct Number: [phone]  Fax: [phone]

## 2022-07-01 DIAGNOSIS — Z1321 Encounter for screening for nutritional disorder: Secondary | ICD-10-CM | POA: Diagnosis not present

## 2022-07-01 DIAGNOSIS — Z131 Encounter for screening for diabetes mellitus: Secondary | ICD-10-CM | POA: Diagnosis not present

## 2022-07-01 DIAGNOSIS — N951 Menopausal and female climacteric states: Secondary | ICD-10-CM | POA: Diagnosis not present

## 2022-07-01 DIAGNOSIS — Z1231 Encounter for screening mammogram for malignant neoplasm of breast: Secondary | ICD-10-CM | POA: Diagnosis not present

## 2022-07-01 DIAGNOSIS — F411 Generalized anxiety disorder: Secondary | ICD-10-CM | POA: Diagnosis not present

## 2022-07-01 DIAGNOSIS — Z01419 Encounter for gynecological examination (general) (routine) without abnormal findings: Secondary | ICD-10-CM | POA: Diagnosis not present

## 2022-07-01 DIAGNOSIS — Z1151 Encounter for screening for human papillomavirus (HPV): Secondary | ICD-10-CM | POA: Diagnosis not present

## 2022-07-01 DIAGNOSIS — Z1272 Encounter for screening for malignant neoplasm of vagina: Secondary | ICD-10-CM | POA: Diagnosis not present

## 2022-07-01 LAB — HM MAMMOGRAPHY

## 2022-07-02 LAB — HM PAP SMEAR: HPV, high-risk: NEGATIVE

## 2022-07-12 ENCOUNTER — Ambulatory Visit (INDEPENDENT_AMBULATORY_CARE_PROVIDER_SITE_OTHER)
Admission: RE | Admit: 2022-07-12 | Discharge: 2022-07-12 | Disposition: A | Discharge status: Home / Self Care | Payer: Federal, State, Local not specified - PPO | Source: Ambulatory Visit | Attending: Family Medicine | Admitting: Family Medicine

## 2022-07-12 ENCOUNTER — Ambulatory Visit: Payer: Federal, State, Local not specified - PPO | Admitting: Family Medicine

## 2022-07-12 ENCOUNTER — Encounter: Payer: Self-pay | Admitting: Family Medicine

## 2022-07-12 VITALS — BP 112/70 | HR 82 | Temp 97.7°F | Ht 66.0 in | Wt 157.2 lb

## 2022-07-12 DIAGNOSIS — G8929 Other chronic pain: Secondary | ICD-10-CM

## 2022-07-12 DIAGNOSIS — M25562 Pain in left knee: Secondary | ICD-10-CM | POA: Diagnosis not present

## 2022-07-12 NOTE — Assessment & Plan Note (Signed)
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

## 2022-07-12 NOTE — Patient Instructions (Signed)
Try ice instead of heat Continue the topical anti inflammatory   Avoid overly painful activities  Get a simple compression sleeve for knee (neoprene)   Xray now We will reach out with results and plan

## 2022-07-12 NOTE — Progress Notes (Unsigned)
   Subjective:    Patient ID: [name], female    DOB: April 12, 1970, 52 y.o.   MRN: [id number]  HPI Pt presents for ongoing knee pain   Wt Readings from Last 3 Encounters:  07/12/22 157 lb 4 oz (71.3 kg)  06/13/22 153 lb 3.2 oz (69.5 kg)  05/28/22 153 lb (69.4 kg)   25.38 kg/m  Vitals:   07/12/22 1005  BP: 112/70  Pulse: 82  Temp: 97.7 F (36.5 C)  SpO2: 98%   Pain is mostly tateral Runs down the side and rad to the lower leg at the time   No trauma that she remembers About 1.5 months   Looked a little swollen  Cannot fully extend it or bend it   No crunching or noise   Surgery on this knee as a child - tore cartilage playing volleyball Arthroscopic Dr Eulah Pont     Hurts to pedal her bike Walking (work) bothers it   She was seen by NP Evelene Croon at [facility] on 3/7 Was tx with topical voltaren gel and prednisone   Helped for just a little bit   Still using topical   Review of Systems     Objective:   Physical Exam Musculoskeletal:     Comments: Knee leftL  Slt swelling, unable to tell if effusion  No warmth to the touch  No crepitus  ROM: limited full flex and ext due to pain  [place] causes lateral pulling sensation  Bounce test causes lateral pain   Stability: Anterior drawer-nl Lachman exam -nl  Tenderness: lat joint line and patellofemoral area   Gait : favors RLE              Assessment & Plan:

## 2022-07-15 DIAGNOSIS — F411 Generalized anxiety disorder: Secondary | ICD-10-CM | POA: Diagnosis not present

## 2022-07-16 NOTE — Progress Notes (Signed)
Martavis Gurney T. Declin Rajan, MD, CAQ Sports Medicine [facility] at [facility] [address] [place], [address]  Phone: [phone]  FAX: [phone]  [name] - 52 y.o. female  MRN [id number]  Date of Birth: 1970-07-20  Date: 07/17/2022  PCP: Judy Pimple, MD  Referral: Judy Pimple, MD  Chief Complaint  Patient presents with   Knee Pain    Left   Subjective:   [name] is a 52 y.o. very pleasant female patient with Body mass index is 25.38 kg/m. who presents with the following:  Patient presents with ongoing left-sided knee pain.  This has been present for about 6 weeks.  Roughly 4 weeks ago, the patient was seen by MS. Evelene Croon, NP, at [facility] and given a round of some oral steroids.  Voltaren gel.  She also recently saw her primary care doctor at the end of last week.  She has been icing her knee in addition to using a compressive sleeve.  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  Lateral knee is pulling, and pain with bending it all the way in.  Sometimes will shoot down leg.  Surgery - tear of cartilage on patella many years ago.  The pain seems to be slightly caudal to the knee joint, and she does have some pain in the distal lower extremity, as well.  Minimal joint line tenderness.  She is not having any locking up of the joint or symptomatic giving way.  Knee sleeve - ? If helps.   Review of Systems is noted in the HPI, as appropriate  Objective:   BP 110/80   Pulse 83   Temp 98.3 F (36.8 C) (Temporal)   Ht  (1.676 m)   Wt 157 lb 4 oz (71.3 kg)   SpO2 99%   BMI 25.38 kg/m   GEN: No acute distress; alert,appropriate. PULM: Breathing comfortably in no respiratory distress PSYCH: Normally interactive.   Left knee: Full extension.  Flexion to 125.  Stable to varus and valgus stress.  Minimal joint line tenderness.  No patellar apprehension and no significant  pain at the patellar facet joints. Nontender at the patellar tendon and quadricep tendon just lateral to the quadricep tendon she does have some tenderness to palpation.  She also has some lateral tenderness at the iliotibial band.  This seems to be the area of maximal tenderness.  Laboratory and Imaging Data: DG Knee 4 Views W/Patella Left  Result Date: 07/12/2022 CLINICAL DATA:  Left knee pain. EXAM: LEFT KNEE - COMPLETE 4+ VIEW COMPARISON:  None Available. FINDINGS: No acute fracture or dislocation. No joint effusion. Joint spaces are preserved. Tiny lateral compartment marginal osteophytes. Bone mineralization is normal. Soft tissues are unremarkable. Single frontal view of the right knee is unremarkable. IMPRESSION: 1. Early lateral compartment degenerative changes. Electronically Signed   By: Obie Dredge M.D.   On: 07/12/2022 11:54     Assessment and Plan:     ICD-10-CM   1. Iliotibial band syndrome affecting lower leg, left  M76.32 Ambulatory referral to Physical Therapy    2. Acute pain of left knee  M25.562 Ambulatory referral to Physical Therapy     Extra-articular knee pain with a component of distal quadriceps as well as iliotibial band syndrome.  I gave her a comprehensive rehab program for this to start now, and also get and have her start doing some formal  physical therapy.  I am also going to give her a 14-day case of oral steroids to take.  Doubt internal derangement.  Medication Management during today's office visit: Meds ordered this encounter  Medications   predniSONE (DELTASONE) 20 MG tablet    Sig: 2 tabs po for 7 days, then 1 tab po for 7 days    Dispense:  21 tablet    Refill:  0   There are no discontinued medications.  Orders placed today for conditions managed today: Orders Placed This Encounter  Procedures   Ambulatory referral to Physical Therapy    Disposition: No follow-ups on file.  [employer] One speech-to-text software was used for  transcription in this dictation.  Possible transcriptional errors can occur using Animal nutritionist.   Signed,  Elpidio Galea. Loreena Valeri, MD   Outpatient Encounter Medications as of 07/17/2022  Medication Sig   diclofenac Sodium (VOLTAREN ARTHRITIS PAIN) 1 % GEL Apply 2 g topically 4 (four) times daily.   escitalopram (LEXAPRO) 20 MG tablet Take 1 tablet (20 mg total) by mouth daily.   predniSONE (DELTASONE) 20 MG tablet 2 tabs po for 7 days, then 1 tab po for 7 days   No facility-administered encounter medications on file as of 07/17/2022.

## 2022-07-17 ENCOUNTER — Ambulatory Visit: Payer: Federal, State, Local not specified - PPO | Admitting: Family Medicine

## 2022-07-17 ENCOUNTER — Encounter: Payer: Self-pay | Admitting: Family Medicine

## 2022-07-17 VITALS — BP 110/80 | HR 83 | Temp 98.3°F | Ht 66.0 in | Wt 157.2 lb

## 2022-07-17 DIAGNOSIS — M25562 Pain in left knee: Secondary | ICD-10-CM | POA: Diagnosis not present

## 2022-07-17 DIAGNOSIS — M7632 Iliotibial band syndrome, left leg: Secondary | ICD-10-CM

## 2022-07-17 MED ORDER — PREDNISONE 20 MG PO TABS: ORAL_TABLET | ORAL | 0 refills | Status: DC

## 2022-07-19 ENCOUNTER — Encounter: Payer: Self-pay | Admitting: Family Medicine

## 2022-08-28 ENCOUNTER — Ambulatory Visit: Payer: Federal, State, Local not specified - PPO | Admitting: Family Medicine

## 2023-01-01 ENCOUNTER — Ambulatory Visit: Payer: Federal, State, Local not specified - PPO | Admitting: Psychiatry

## 2023-01-01 ENCOUNTER — Encounter: Payer: Self-pay | Admitting: Psychiatry

## 2023-01-01 NOTE — Progress Notes (Deleted)
   CC:  headaches  Follow-up Visit  Last visit: 05/28/22  Brief HPI: 52 year old female with a history of kidney stones, TMJ, cervical spondylosis s/p fusion, anxiety who follows in clinic for migraines.  At her last visit she was started on Qulipta for migraine prevention and naratriptan for rescue. Interval History: ***   Headache days per month: *** Migraine days per month*** Headache free days per month: ***  Current Headache Regimen: Preventative: *** Abortive: ***   Prior Therapies                                  Rescue: Flexeril 10 mg PRN Excedrin migraine Maxalt 10 mg PRN Naratriptan 2.5 mg PRN   Prevention: Lexapro Amitriptyline Gabapentin Cymbalta nortriptyline Propranolol atenolol Topamax contraindicated due to kidney stones Aimovig 70 mg monthly - pain at injection site Botox - lack of efficacy Qulipta 10 mg daily    Physical Exam:   Vital Signs: There were no vitals taken for this visit. GENERAL:  well appearing, in no acute distress, alert  SKIN:  Color, texture, turgor normal. No rashes or lesions HEAD:  Normocephalic/atraumatic. RESP: normal respiratory effort MSK:  No gross joint deformities.   NEUROLOGICAL: Mental Status: Alert, oriented to person, place and time, Follows commands, and Speech fluent and appropriate. Cranial Nerves: PERRL, face symmetric, no dysarthria, hearing grossly intact Motor: moves all extremities equally Gait: normal-based.  IMPRESSION: ***  PLAN: ***   Follow-up: ***  I spent a total of *** minutes on the date of the service. Headache education was done. Discussed lifestyle modification including increased oral hydration, decreased caffeine, exercise and stress management. Discussed treatment options including preventive and acute medications, natural supplements, and infusion therapy. Discussed medication overuse headache and to limit use of acute treatments to no more than 2 days/week or 10 days/month.  Discussed medication side effects, adverse reactions and drug interactions. Written educational materials and patient instructions outlining all of the above were given.  Ocie Doyne, MD

## 2023-01-10 ENCOUNTER — Telehealth: Payer: Self-pay

## 2023-01-10 NOTE — Telephone Encounter (Signed)
A user error has taken place: encounter opened in error, closed for administrative reasons.

## 2023-06-02 ENCOUNTER — Encounter: Payer: Self-pay | Admitting: Family Medicine

## 2023-06-02 ENCOUNTER — Ambulatory Visit (INDEPENDENT_AMBULATORY_CARE_PROVIDER_SITE_OTHER): Payer: Federal, State, Local not specified - PPO | Admitting: Family Medicine

## 2023-06-02 VITALS — BP 134/84 | HR 73 | Temp 98.1°F | Ht 65.0 in | Wt 156.2 lb

## 2023-06-02 DIAGNOSIS — Z0000 Encounter for general adult medical examination without abnormal findings: Secondary | ICD-10-CM

## 2023-06-02 DIAGNOSIS — Z114 Encounter for screening for human immunodeficiency virus [HIV]: Secondary | ICD-10-CM | POA: Diagnosis not present

## 2023-06-02 DIAGNOSIS — Z1159 Encounter for screening for other viral diseases: Secondary | ICD-10-CM | POA: Diagnosis not present

## 2023-06-02 DIAGNOSIS — F430 Acute stress reaction: Secondary | ICD-10-CM

## 2023-06-02 NOTE — Assessment & Plan Note (Signed)
 Remove history of taking care of child with hep C Has tested neg in past

## 2023-06-02 NOTE — Assessment & Plan Note (Signed)
 Overall doing better  Stressful event was 2 y ago PHQ 8  No longer taking lexarpro / does not desire treatment

## 2023-06-02 NOTE — Assessment & Plan Note (Signed)
 HIV screen with lab today  Low risk

## 2023-06-02 NOTE — Progress Notes (Signed)
 Subjective:    Patient ID: [name], female    DOB: 09-16-1970, 53 y.o.   MRN: [id number]  HPI  Here for health maintenance exam and to review chronic medical problems   Wt Readings from Last 3 Encounters:  06/02/23 156 lb 4 oz (70.9 kg)  07/17/22 157 lb 4 oz (71.3 kg)  07/12/22 157 lb 4 oz (71.3 kg)   26.00 kg/m  Vitals:   06/02/23 1558  BP: 134/84  Pulse: 73  Temp: 98.1 F (36.7 C)  SpO2: 100%    Immunization History  Administered Date(s) Administered   PFIZER(Purple Top)SARS-COV-2 Vaccination 02/13/2020   Td 04/08/2005   Tdap 05/22/2015    Health Maintenance Due  Topic Date Due   HIV Screening  Never done   Feeling ok overall     Flu shot -declines   Shingrix - may be interested in later   [facility] , is scheduled April the 7th  Self breast exam-no lumps   Gyn health Goes to gyn  Pap 06/2022 normal with neg HPV  Screening for hep C and HIV today  Had remotely fostered a child with hep c    Colon cancer screening  colonoscopy 05/2019 with 10 y recall (hyperplastic polyp)    Bone health   Falls-none Fractures-none  Supplements -none    Exercise  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  Uses stationary bike     Mood    06/02/2023    4:31 PM 06/13/2022    3:53 PM 04/24/2022    3:36 PM 03/25/2022    3:14 PM 10/22/2019    4:30 PM  Depression screen PHQ 2/9  Decreased Interest 0 1 0 1 0  Down, Depressed, Hopeless 0 1 1 1  0  PHQ - 2 Score 0 2 1 2  0  Altered sleeping 2 3 3 3    Tired, decreased energy 1 3 2 2    Change in appetite 2 1 2 2    Feeling bad or failure about yourself  0 2 0 1   Trouble concentrating 1 3 2 2    Moving slowly or fidgety/restless 2 0 0 3   Suicidal thoughts 0 0 0 --   PHQ-9 Score 8 14 10 15    Difficult doing work/chores  Not difficult at all Not difficult at all     Doing some better after her last foster left /that was hard on her    Lab  Results  Component Value Date   CHOL 218 (H) 03/25/2022   HDL 85.60 03/25/2022   LDLCALC 122 (H) 03/25/2022   TRIG 53.0 03/25/2022   CHOLHDL 3 03/25/2022   For labs today      Patient Active Problem List   Diagnosis Date Noted   Encounter for hepatitis C screening test for low risk patient 06/02/2023   Encounter for screening for HIV 06/02/2023   Left knee pain 06/25/2022   Paresthesia of both hands 04/27/2020   Fatigue 04/27/2020   Joint pain 04/27/2020   Stress reaction 10/22/2019   Heartburn 04/12/2019   Hip dysplasia, acquired, left 12/15/2017   Routine general medical examination at a health care facility 05/13/2015   Encounter for physical examination of prospective foster parent 05/09/2014   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   Migraine without aura 01/13/2007   Past Medical History:  Diagnosis Date   Kidney stones    Migraine    chronic, daily   Temporomandibular  joint disorders, unspecified    Past Surgical History:  Procedure Laterality Date   ABDOMINAL HYSTERECTOMY     KNEE CARTILAGE SURGERY  1988   torn cartilage   NECK SURGERY     PARTIAL HYSTERECTOMY  1/07   Endometriosis; ovarian cyst (1/2 ovary on left)   TUBAL LIGATION     Social History   Tobacco Use   Smoking status: Never   Smokeless tobacco: Never  Substance Use Topics   Alcohol use: No    Alcohol/week: 0.0 standard drinks of alcohol   Drug use: No   Family History  Problem Relation Age of Onset   Hypertension Mother    Parkinson's disease Mother    Diabetes Father    Color blindness Father    Hypertension Sister    Cataracts Son        childhood   Esophageal cancer Maternal Grandmother    Ovarian cancer Paternal Grandmother    Cataracts Paternal Grandmother    Pancreatitis Paternal Grandfather    Pancreatic cancer Paternal Grandfather    Diabetes Other        GM   Cataracts Other        Aunt   Melanoma Other        grandparent   Migraines Other        uncle    Colon cancer Neg Hx    Rectal cancer Neg Hx    Stomach cancer Neg Hx    Allergies  Allergen Reactions   Buspar [Buspirone]     Nausea    Morphine Sulfate Itching    Itching and redness of skin   No current outpatient medications on file prior to visit.   No current facility-administered medications on file prior to visit.    Review of Systems  Constitutional:  Negative for activity change, appetite change, fatigue, fever and unexpected weight change.  HENT:  Negative for congestion, ear pain, rhinorrhea, sinus pressure and sore throat.   Eyes:  Negative for pain, redness and visual disturbance.  Respiratory:  Negative for cough, shortness of breath and wheezing.   Cardiovascular:  Negative for chest pain and palpitations.  Gastrointestinal:  Negative for abdominal pain, blood in stool, constipation and diarrhea.  Endocrine: Negative for polydipsia and polyuria.  Genitourinary:  Negative for dysuria, frequency and urgency.  Musculoskeletal:  Negative for arthralgias, back pain and myalgias.  Skin:  Negative for pallor and rash.  Allergic/Immunologic: Negative for environmental allergies.  Neurological:  Negative for dizziness, syncope and headaches.  Hematological:  Negative for adenopathy. Does not bruise/bleed easily.  Psychiatric/Behavioral:  Negative for decreased concentration and dysphoric mood. The patient is not nervous/anxious.        Still sad at times but overall better        Objective:   Physical Exam Constitutional:      General: She is not in acute distress.    Appearance: Normal appearance. She is well-developed and normal weight. She is not ill-appearing or diaphoretic.  HENT:     Head: Normocephalic and atraumatic.     Right Ear: Tympanic membrane, ear canal and external ear normal.     Left Ear: Tympanic membrane, ear canal and external ear normal.     Nose: Nose normal. No congestion.     Mouth/Throat:     Mouth: Mucous membranes are moist.      Pharynx: Oropharynx is clear. No posterior oropharyngeal erythema.  Eyes:     General: No scleral icterus.    Extraocular  Movements: Extraocular movements intact.     Conjunctiva/sclera: Conjunctivae normal.     Pupils: Pupils are equal, round, and reactive to light.  Neck:     Thyroid: No thyromegaly.     Vascular: No carotid bruit or JVD.  Cardiovascular:     Rate and Rhythm: Normal rate and regular rhythm.     Pulses: Normal pulses.     Heart sounds: Normal heart sounds.     No gallop.  Pulmonary:     Effort: Pulmonary effort is normal. No respiratory distress.     Breath sounds: Normal breath sounds. No wheezing.     Comments: Good air exch Chest:     Chest wall: No tenderness.  Abdominal:     General: Bowel sounds are normal. There is no distension or abdominal bruit.     Palpations: Abdomen is soft. There is no mass.     Tenderness: There is no abdominal tenderness.     Hernia: No hernia is present.  Genitourinary:    Comments: Breast and pelvic exam are done by gyn provider   Musculoskeletal:        General: No tenderness. Normal range of motion.     Cervical back: Normal range of motion and neck supple. No rigidity. No muscular tenderness.     Right lower leg: No edema.     Left lower leg: No edema.     Comments: No kyphosis   Lymphadenopathy:     Cervical: No cervical adenopathy.  Skin:    General: Skin is warm and dry.     Coloration: Skin is not pale.     Findings: No erythema or rash.     Comments: Solar lentigines diffusely   Neurological:     Mental Status: She is alert. Mental status is at baseline.     Cranial Nerves: No cranial nerve deficit.     Motor: No abnormal muscle tone.     Coordination: Coordination normal.     Gait: Gait normal.     Deep Tendon Reflexes: Reflexes are normal and symmetric. Reflexes normal.  Psychiatric:        Mood and Affect: Mood normal.        Cognition and Memory: Cognition and memory normal.            Assessment & Plan:   Problem List Items Addressed This Visit       Other   Routine general medical examination at a health care facility - Primary   Reviewed health habits including diet and exercise and skin cancer prevention Reviewed appropriate screening tests for age  Also reviewed health mt list, fam hx and immunization status , as well as social and family history   See HPI Labs reviewed and ordered Health Maintenance  Topic Date Due   HIV Screening  Never done   Flu Shot  07/07/2023*   Zoster (Shingles) Vaccine (1 of 2) 08/30/2023*   COVID-19 Vaccine (2 - 2024-25 season) 06/17/2024*   Mammogram  07/01/2023   DTaP/Tdap/Td vaccine (3 - Td or Tdap) 05/21/2025   Pap with HPV screening  07/02/2027   Colon Cancer Screening  05/13/2029   Hepatitis C Screening  Completed   HPV Vaccine  Aged Out  *Topic was postponed. The date shown is not the original due date.   Has gyn appointment planned in April for exam and mammogram  Declines flu shot  Considering shingrix later  Discussed fall prevention, supplements and exercise for bone density  Relevant Orders   CBC with Differential/Platelet   Comprehensive metabolic panel   Lipid Panel   TSH   Encounter for screening for HIV   HIV screen with lab today  Low risk      Relevant Orders   HIV Antibody (routine testing w rflx)   Encounter for hepatitis C screening test for low risk patient   Remove history of taking care of child with hep C Has tested neg in past       Relevant Orders   Hepatitis C Antibody

## 2023-06-02 NOTE — Patient Instructions (Addendum)
 If you are interested in the shingles vaccine series (Shingrix), call your insurance or pharmacy to check on coverage and location it must be given.  If affordable - you can schedule it here or at your pharmacy depending on coverage   Try to get 1200-1500 mg of calcium per day with at least 2000 iu of vitamin D - for bone health   Stay active  Add some strength training to your routine, this is important for bone and brain health and can reduce your risk of falls and help your body use insulin properly and regulate weight  Light weights, exercise bands , and internet videos are a good way to start  Yoga (chair or regular), machines , floor exercises or a gym with machines are also good options    Any amount of muscle building will help keep up your metabolism   Increase lean protein if you can The following are examples of protein in diet  Meat  Fish  Eggs  Dairy products  Soy products  Oat milk  Almond milk Nuts and nut butters  Dried beans   Labs today   See gyn as planned in April

## 2023-06-02 NOTE — Assessment & Plan Note (Addendum)
 Reviewed health habits including diet and exercise and skin cancer prevention Reviewed appropriate screening tests for age  Also reviewed health mt list, fam hx and immunization status , as well as social and family history   See HPI Labs reviewed and ordered Health Maintenance  Topic Date Due   HIV Screening  Never done   Flu Shot  07/07/2023*   Zoster (Shingles) Vaccine (1 of 2) 08/30/2023*   COVID-19 Vaccine (2 - 2024-25 season) 06/17/2024*   Mammogram  07/01/2023   DTaP/Tdap/Td vaccine (3 - Td or Tdap) 05/21/2025   Pap with HPV screening  07/02/2027   Colon Cancer Screening  05/13/2029   Hepatitis C Screening  Completed   HPV Vaccine  Aged Out  *Topic was postponed. The date shown is not the original due date.   Has gyn appointment planned in April for exam and mammogram  Declines flu shot  Considering shingrix later  Discussed fall prevention, supplements and exercise for bone density  PHQ of 8 ,improved now that stressful event was 2 y ago / declines therapy

## 2023-06-03 ENCOUNTER — Encounter: Payer: Self-pay | Admitting: Family Medicine

## 2023-06-03 LAB — CBC WITH DIFFERENTIAL/PLATELET
Basophils Absolute: 0.1 10*3/uL (ref 0.0–0.1)
Basophils Relative: 1 % (ref 0.0–3.0)
Eosinophils Absolute: 0.1 10*3/uL (ref 0.0–0.7)
Eosinophils Relative: 1.4 % (ref 0.0–5.0)
HCT: 37.2 % (ref 36.0–46.0)
Hemoglobin: 12.8 g/dL (ref 12.0–15.0)
Lymphocytes Relative: 31.1 % (ref 12.0–46.0)
Lymphs Abs: 1.7 10*3/uL (ref 0.7–4.0)
MCHC: 34.3 g/dL (ref 30.0–36.0)
MCV: 90.1 fl (ref 78.0–100.0)
Monocytes Absolute: 0.5 10*3/uL (ref 0.1–1.0)
Monocytes Relative: 8.5 % (ref 3.0–12.0)
Neutro Abs: 3.2 10*3/uL (ref 1.4–7.7)
Neutrophils Relative %: 58 % (ref 43.0–77.0)
Platelets: 235 10*3/uL (ref 150.0–400.0)
RBC: 4.13 Mil/uL (ref 3.87–5.11)
RDW: 12.6 % (ref 11.5–15.5)
WBC: 5.6 10*3/uL (ref 4.0–10.5)

## 2023-06-03 LAB — COMPREHENSIVE METABOLIC PANEL
ALT: 27 U/L (ref 0–35)
AST: 34 U/L (ref 0–37)
Albumin: 4.5 g/dL (ref 3.5–5.2)
Alkaline Phosphatase: 51 U/L (ref 39–117)
BUN: 11 mg/dL (ref 6–23)
CO2: 31 meq/L (ref 19–32)
Calcium: 9.1 mg/dL (ref 8.4–10.5)
Chloride: 102 meq/L (ref 96–112)
Creatinine, Ser: 0.82 mg/dL (ref 0.40–1.20)
GFR: 82.46 mL/min (ref >60.00)
Glucose, Bld: 85 mg/dL (ref 70–99)
Potassium: 3.9 meq/L (ref 3.5–5.1)
Sodium: 139 meq/L (ref 135–145)
Total Bilirubin: 0.5 mg/dL (ref 0.2–1.2)
Total Protein: 7.2 g/dL (ref 6.0–8.3)

## 2023-06-03 LAB — LIPID PANEL
Cholesterol: 212 mg/dL — ABNORMAL HIGH (ref 0–200)
HDL: 66.7 mg/dL (ref >39.00)
LDL Cholesterol: 123 mg/dL — ABNORMAL HIGH (ref 0–99)
NonHDL: 145.17
Total CHOL/HDL Ratio: 3
Triglycerides: 112 mg/dL (ref 0.0–149.0)
VLDL: 22.4 mg/dL (ref 0.0–40.0)

## 2023-06-03 LAB — TSH: TSH: 1.28 u[IU]/mL (ref 0.35–5.50)

## 2023-06-03 LAB — HEPATITIS C ANTIBODY: Hepatitis C Ab: NONREACTIVE

## 2023-06-03 LAB — HIV ANTIBODY (ROUTINE TESTING W REFLEX): HIV 1&2 Ab, 4th Generation: NONREACTIVE

## 2023-06-05 ENCOUNTER — Encounter: Payer: Self-pay | Admitting: *Deleted

## 2023-07-14 DIAGNOSIS — Z1231 Encounter for screening mammogram for malignant neoplasm of breast: Secondary | ICD-10-CM | POA: Diagnosis not present

## 2023-07-14 DIAGNOSIS — Z01419 Encounter for gynecological examination (general) (routine) without abnormal findings: Secondary | ICD-10-CM | POA: Diagnosis not present

## 2023-08-22 ENCOUNTER — Encounter: Payer: Self-pay | Admitting: Family Medicine

## 2023-08-22 DIAGNOSIS — R140 Abdominal distension (gaseous): Secondary | ICD-10-CM

## 2023-08-22 DIAGNOSIS — R1013 Epigastric pain: Secondary | ICD-10-CM

## 2023-08-25 NOTE — Telephone Encounter (Signed)
 I placed an external referral for GI  Let me know please if I did not do it right  Thanks   I never know if external referrals are seen or if I need to alert someone

## 2023-08-28 ENCOUNTER — Telehealth: Payer: Self-pay | Admitting: Family Medicine

## 2023-08-28 DIAGNOSIS — R1013 Epigastric pain: Secondary | ICD-10-CM

## 2023-08-28 DIAGNOSIS — R140 Abdominal distension (gaseous): Secondary | ICD-10-CM

## 2023-08-28 NOTE — Addendum Note (Signed)
 Addended by: Deri Fleet A on: 08/28/2023 12:01 PM   Modules accepted: Orders

## 2023-08-28 NOTE — Telephone Encounter (Signed)
 I referred to [facility] [place]  Please give her the # to schedule   Let her know it will take a while to get in   Thanks   If she needs to follow up here in the meantime, do so

## 2023-08-28 NOTE — Telephone Encounter (Signed)
 Copied from CRM [id number]. Topic: Referral - Status >> Aug 27, 2023  3:51 PM Opal Bill wrote: Reason for CRM: [facility] called and informed that Dr. Tova Fresh is unable to see this patient. A referral would have to submitted to another doctor/facility.

## 2023-08-29 ENCOUNTER — Other Ambulatory Visit: Payer: Self-pay | Admitting: Family Medicine

## 2023-08-29 DIAGNOSIS — R140 Abdominal distension (gaseous): Secondary | ICD-10-CM

## 2023-08-29 DIAGNOSIS — R1013 Epigastric pain: Secondary | ICD-10-CM

## 2023-08-29 NOTE — Telephone Encounter (Signed)
 Spoke with pt and she is aware of Dr. Belva Boyden response. Pt does not want to be seen at [facility]. States that they have a 2.7 rating on [employer] and does not want to go there. [facility] did not tell her that they could not see her, they told her that they could not see her without a referral. Pt requests for the referral to be changed.

## 2023-08-29 NOTE — Telephone Encounter (Signed)
 Ok I put in a new external referral for [facility]   [facility] I did it right  I must have mis read/mis understood the first /2nd message    Thanks

## 2023-09-08 ENCOUNTER — Telehealth: Payer: Self-pay

## 2023-09-08 NOTE — Telephone Encounter (Signed)
 Copied from CRM [id number]. Topic: Referral - Status >> Sep 08, 2023 10:38 AM Jenice Mitts wrote: Reason for CRM: Patient is calling in about her referal to GI doctor  Can be reached at number on file

## 2023-09-11 ENCOUNTER — Telehealth: Payer: Self-pay | Admitting: Family Medicine

## 2023-09-11 NOTE — Telephone Encounter (Signed)
 Will route to referral dpt

## 2023-09-11 NOTE — Telephone Encounter (Signed)
 Copied from CRM [id number]. Topic: Referral - Question >> Sep 11, 2023  2:07 PM [name] wrote: Reason for CRM: Patient called in to speak with someone from the referral team regarding her GI referral. Has been waiting since Monday and would like a call back. Please call [phone]

## 2023-09-15 ENCOUNTER — Encounter: Payer: Self-pay | Admitting: Gastroenterology

## 2023-09-16 NOTE — Telephone Encounter (Signed)
 See 09/08/2023 telephone encounter... this is a duplicate telephone message. This request was addressed in the previous message

## 2023-09-16 NOTE — Telephone Encounter (Signed)
 There were three different referrals placed for this patient, same issue/diagnosis.   First referral placed on 08/25/23 was sent to Dr Haidee Lev office in [place], patient notified via Mychart letter on this same date.   Second referral placed on 08/28/23 was sent to [facility] in [place], there was no mention in the referral as to a preferred referral location.   Third referral placed on 08/29/23 to Dr Haidee Lev office, was closed out/canceled due to being a duplicate referral. This was already handled on 08/25/23.   I have gone back and closed out the referral to [facility] since all telephone encounters in Epic are the patient requesting Dr Haidee Lev office.   This referral has been addressed and the patient notified via MyChart letter.

## 2023-09-16 NOTE — Telephone Encounter (Signed)
 Thanks so much ! Would someone reach out to pt and let her know that status  She can cancel with [facility] if she wants to see Dr Tova Fresh and it sounds like that referral is still active  So sorry about the multiple referrals- I was getting confused

## 2023-09-16 NOTE — Telephone Encounter (Signed)
 After further review it looks like the patient scheduled an appt with [facility] for 10/24/2023 at 9:30am with Valiant Gaul, PA.    If she is wanting to schedule with Dr Tova Fresh at [facility] then she will need to call and cancel her appt with [facility] and contact for [facility] office to schedule.

## 2023-10-03 ENCOUNTER — Encounter: Payer: Self-pay | Admitting: *Deleted

## 2023-10-03 NOTE — Telephone Encounter (Signed)
 See MyChart message 10/03/23

## 2023-10-24 ENCOUNTER — Ambulatory Visit: Admitting: Gastroenterology

## 2023-10-24 ENCOUNTER — Other Ambulatory Visit (INDEPENDENT_AMBULATORY_CARE_PROVIDER_SITE_OTHER)

## 2023-10-24 ENCOUNTER — Encounter: Payer: Self-pay | Admitting: Gastroenterology

## 2023-10-24 VITALS — BP 118/76 | HR 78 | Ht 65.0 in | Wt 158.4 lb

## 2023-10-24 DIAGNOSIS — R6881 Early satiety: Secondary | ICD-10-CM

## 2023-10-24 DIAGNOSIS — K5900 Constipation, unspecified: Secondary | ICD-10-CM

## 2023-10-24 DIAGNOSIS — R140 Abdominal distension (gaseous): Secondary | ICD-10-CM | POA: Diagnosis not present

## 2023-10-24 DIAGNOSIS — R1013 Epigastric pain: Secondary | ICD-10-CM

## 2023-10-24 DIAGNOSIS — R110 Nausea: Secondary | ICD-10-CM

## 2023-10-24 DIAGNOSIS — R09A2 Foreign body sensation, throat: Secondary | ICD-10-CM

## 2023-10-24 DIAGNOSIS — R1310 Dysphagia, unspecified: Secondary | ICD-10-CM

## 2023-10-24 LAB — CBC WITH DIFFERENTIAL/PLATELET
Basophils Absolute: 0 K/uL (ref 0.0–0.1)
Basophils Relative: 0.3 % (ref 0.0–3.0)
Eosinophils Absolute: 0 K/uL (ref 0.0–0.7)
Eosinophils Relative: 0.6 % (ref 0.0–5.0)
HCT: 37.6 % (ref 36.0–46.0)
Hemoglobin: 12.8 g/dL (ref 12.0–15.0)
Lymphocytes Relative: 24.9 % (ref 12.0–46.0)
Lymphs Abs: 1.2 K/uL (ref 0.7–4.0)
MCHC: 34.1 g/dL (ref 30.0–36.0)
MCV: 89.4 fl (ref 78.0–100.0)
Monocytes Absolute: 0.4 K/uL (ref 0.1–1.0)
Monocytes Relative: 8.6 % (ref 3.0–12.0)
Neutro Abs: 3.3 K/uL (ref 1.4–7.7)
Neutrophils Relative %: 65.6 % (ref 43.0–77.0)
Platelets: 199 K/uL (ref 150.0–400.0)
RBC: 4.2 Mil/uL (ref 3.87–5.11)
RDW: 13.1 % (ref 11.5–15.5)
WBC: 5 K/uL (ref 4.0–10.5)

## 2023-10-24 LAB — COMPREHENSIVE METABOLIC PANEL WITH GFR
ALT: 30 U/L (ref 0–35)
AST: 39 U/L — ABNORMAL HIGH (ref 0–37)
Albumin: 4.6 g/dL (ref 3.5–5.2)
Alkaline Phosphatase: 58 U/L (ref 39–117)
BUN: 10 mg/dL (ref 6–23)
CO2: 31 meq/L (ref 19–32)
Calcium: 9.4 mg/dL (ref 8.4–10.5)
Chloride: 102 meq/L (ref 96–112)
Creatinine, Ser: 0.71 mg/dL (ref 0.40–1.20)
GFR: 97.75 mL/min (ref >60.00)
Glucose, Bld: 89 mg/dL (ref 70–99)
Potassium: 3.7 meq/L (ref 3.5–5.1)
Sodium: 140 meq/L (ref 135–145)
Total Bilirubin: 0.5 mg/dL (ref 0.2–1.2)
Total Protein: 7.3 g/dL (ref 6.0–8.3)

## 2023-10-24 LAB — TSH: TSH: 1.76 u[IU]/mL (ref 0.35–5.50)

## 2023-10-24 LAB — LIPASE: Lipase: 36 U/L (ref 11.0–59.0)

## 2023-10-24 NOTE — Progress Notes (Signed)
 BERNIECE ABID [id number] 04/01/1971   Chief Complaint: Nausea  Referring Provider: Randeen Laine LABOR, MD Primary GI MD: Dr. Shila  HPI: [name] is a 52 y.o. female with past medical history of kidney stones, chronic migraine, TMJ disorder, abdominal hysterectomy who presents today for a complaint of nausea.    Patient last seen in office 05/05/2019 by Dr. Nandigam for complaint of intermittent nausea and vomiting as well as evaluation of abnormal LFTs.  On repeat labs liver enzymes were normal with AST 31, ALT 26, alk phos 50, total bilirubin 0.6, albumin 4.5.  Labs 06/02/2023: Normal CBC, normal CMP, elevated cholesterol, normal TSH, negative HIV, negative hep C   Patient states she has been having postprandial nausea and bloating, abdominal distention, and constipation requiring the use of Senokot daily.  The symptoms have been going on for a while.  States she discussed it with her PCP who suggested it may be due to hormonal changes/perimenopause.  Though symptoms have been ongoing, she feels that they are occurring more frequently.  She has sensation of early satiety and feels that food just sits in her stomach.  She can have bloating and distention.  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  She reports a sensation that there is something in her throat after eating, though she denies any acid in her throat or heartburn.  She has occasional nausea but no vomiting.  She typically skips breakfast and then will eat either lunch or dinner.  These eating habits are not new for her.  She has not decreased food intake due to her symptoms.  She denies weight loss and has actually been gaining weight.  She has some occasional trouble swallowing pills.  Feels like they get stuck in her throat, though this occurs infrequently.  Denies any change in medication, diet, or lifestyle.  She is only taking Senokot, and calcium/vitamin D  supplement.  She reports a longstanding history of constipation which has recently worsened.  Has had to use stool softeners daily for about a year.  States she will not have a bowel movement unless she takes this.  Does not get a lot of fiber in her diet.  She denies diarrhea or rectal bleeding.  Prior EGD/colonoscopy 05/2019 with finding of nonbleeding internal hemorrhoids, 1 hyperplastic colon polyp, and gastritis without H. pylori or intestinal metaplasia.  Her grandfather and sister have history of cholecystectomy.  States that her father and son have history of stomach ulcers.  She denies any shortness of breath or chest pain.  Denies history of MI/stroke, not on blood thinners.  Her paternal grandfather had pancreatic cancer.  She denies family history of colon, stomach, or rectal cancer.  Per chart her paternal grandmother had esophageal cancer.  Previous GI Procedures/Imaging   EGD 05/14/2019 - Normal esophagus.  - Gastritis. Biopsied.  - Normal examined duodenum. Path: 1. Surgical [P], gastric antrum and body - MILD CHRONIC GASTRITIS WITHOUT ACTIVITY - NO H. PYLORI OR INTESTINAL METAPLASIA IDENTIFIED - SEE COMMENT  Colonoscopy 05/14/2019 - One 5 mm polyp in the sigmoid colon, removed with a cold snare. Resected and retrieved.  - Non- bleeding internal hemorrhoids.  - The examination was otherwise normal. Path: 2. Surgical [P], colon, sigmoid, polyp - HYPERPLASTIC POLYP (1 OF 1 FRAGMENTS) - NO HIGH GRADE DYSPLASIA OR MALIGNANCY IDENTIFIED  CT A/P 04/16/2019 1. No acute findings in the abdomen or pelvis to explain the patient's symptoms. 2. The  colon is patulous and stool filled to the sigmoid. No obstipating lesion identified. 3. Status post hysterectomy.  Abdominal ultrasound 04/13/2019 - Normal liver ring gallbladder - 8 mm hyperechoic lesion right kidney possible angiomyolipoma or complex cyst. No lesion seen on the prior CT.  Past Medical History:  Diagnosis Date    Kidney stones    Migraine    chronic, daily   Temporomandibular joint disorders, unspecified     Past Surgical History:  Procedure Laterality Date   ABDOMINAL HYSTERECTOMY     KNEE CARTILAGE SURGERY  1988   torn cartilage   NECK SURGERY     PARTIAL HYSTERECTOMY  1/07   Endometriosis; ovarian cyst (1/2 ovary on left)   TUBAL LIGATION      No current outpatient medications on file.   No current facility-administered medications for this visit.    Allergies as of 10/24/2023 - Review Complete 06/02/2023  Allergen Reaction Noted   Buspar  [buspirone ]  12/02/2019   Morphine sulfate Itching 06/14/2006    Family History  Problem Relation Age of Onset   Hypertension Mother    Parkinson's disease Mother    Diabetes Father    Color blindness Father    Hypertension Sister    Cataracts Son        childhood   Esophageal cancer Maternal Grandmother    Ovarian cancer Paternal Grandmother    Cataracts Paternal Grandmother    Pancreatitis Paternal Grandfather    Pancreatic cancer Paternal Grandfather    Diabetes Other        GM   Cataracts Other        Aunt   Melanoma Other        grandparent   Migraines Other        uncle   Colon cancer Neg Hx    Rectal cancer Neg Hx    Stomach cancer Neg Hx     Social History   Tobacco Use   Smoking status: Never   Smokeless tobacco: Never  Substance Use Topics   Alcohol use: No    Alcohol/week: 0.0 standard drinks of alcohol   Drug use: No     Review of Systems:    Constitutional: No weight loss, fever, chills, weakness or fatigue Skin: No rash or itching Cardiovascular: No chest pain, chest pressure or palpitations   Respiratory: No SOB or cough Gastrointestinal: See HPI and otherwise negative Neurological: No headache, dizziness or syncope Musculoskeletal: No new muscle or joint pain Hematologic: No bleeding or bruising    Physical Exam:  Vital signs: BP 118/76   Pulse 78   Ht 5' 5 (1.651 m)   Wt 158 lb 6 oz (71.8  kg)   SpO2 97%   BMI 26.35 kg/m    Constitutional: Pleasant female in NAD, alert and cooperative Head:  Normocephalic and atraumatic.  Eyes: No scleral icterus.  Respiratory: Respirations even and unlabored. Lungs clear to auscultation bilaterally.  No wheezes, crackles, or rhonchi.  Cardiovascular:  Regular rate and rhythm. No murmurs. No peripheral edema. Gastrointestinal:  Soft, nondistended, mild epigastric tenderness to palpation. No rebound or guarding. Normal bowel sounds. No appreciable masses or hepatomegaly. Rectal:  Not performed.  Neurologic:  Alert and oriented x4;  grossly normal neurologically.  Skin:   Dry and intact without significant lesions or rashes. Psychiatric: Oriented to person, place and time. Demonstrates good judgement and reason without abnormal affect or behaviors.   RELEVANT LABS AND IMAGING: CBC    Component Value Date/Time   WBC  5.6 06/02/2023 1610   RBC 4.13 06/02/2023 1610   HGB 12.8 06/02/2023 1610   HCT 37.2 06/02/2023 1610   PLT 235.0 06/02/2023 1610   MCV 90.1 06/02/2023 1610   MCH 31.2 10/29/2019 1135   MCHC 34.3 06/02/2023 1610   RDW 12.6 06/02/2023 1610   LYMPHSABS 1.7 06/02/2023 1610   MONOABS 0.5 06/02/2023 1610   EOSABS 0.1 06/02/2023 1610   BASOSABS 0.1 06/02/2023 1610    CMP     Component Value Date/Time   NA 139 06/02/2023 1610   K 3.9 06/02/2023 1610   CL 102 06/02/2023 1610   CO2 31 06/02/2023 1610   GLUCOSE 85 06/02/2023 1610   BUN 11 06/02/2023 1610   CREATININE 0.82 06/02/2023 1610   CALCIUM 9.1 06/02/2023 1610   PROT 7.2 06/02/2023 1610   ALBUMIN 4.5 06/02/2023 1610   AST 34 06/02/2023 1610   ALT 27 06/02/2023 1610   ALKPHOS 51 06/02/2023 1610   BILITOT 0.5 06/02/2023 1610   GFRNONAA >60 10/29/2019 1135   GFRAA >60 10/29/2019 1135     Assessment/Plan:   Nausea Epigastric pain Abdominal bloating Early satiety Globus sensation Dysphagia Patient with ongoing symptoms of nausea, epigastric pain,  bloating, early satiety, and globus sensation which have been increasing in frequency.  Having some occasional pill dysphagia as well.  Last EGD 2021 with finding of gastritis and negative for H. pylori or intestinal metaplasia.  - Schedule EGD with possible dilation. I thoroughly discussed the procedure with the patient to include nature of the procedure, alternatives, benefits, and risks (including but not limited to bleeding, infection, perforation, anesthesia/cardiac/pulmonary complications). Patient verbalized understanding and gave verbal consent to proceed with procedure.  - Check labs today: CBC, CMP, lipase, TTG, IgA, TSH - Order RUQ US  for further evaluation of postprandial abdominal pain and bloating. - Start Protonix 40 mg daily  Constipation Patient with longstanding history of constipation.  Has been taking Senokot daily which does help her have a bowel movement about daily or every other day.  She feels that constipation has been worsening. Last colonoscopy 2021 with finding of 1 hyperplastic polyp and internal hemorrhoids, otherwise normal.  - Start Benefiber 1 TBSP daily - Start Miralax 1 capful daily - If no improvement consider Linzess - If development of rectal bleeding would schedule colonoscopy  Camie Furbish, PA-C [facility] 10/24/2023, 8:23 AM  Patient Care Team: Tower, Laine LABOR, MD as PCP - General (Family Medicine)

## 2023-10-24 NOTE — Patient Instructions (Signed)
 Your provider has requested that you go to the basement level for lab work before leaving today. Press B on the elevator. The lab is located at the first door on the left as you exit the elevator.  Due to recent changes in healthcare laws, you may see the results of your imaging and laboratory studies on MyChart before your provider has had a chance to review them.  We understand that in some cases there may be results that are confusing or concerning to you. Not all laboratory results come back in the same time frame and the provider may be waiting for multiple results in order to interpret others.  Please give us  48 hours in order for your provider to thoroughly review all the results before contacting the office for clarification of your results.   You have been scheduled for an abdominal ultrasound at [facility] (1st floor of hospital) on 10/27/23 at 10:00 am. Please arrive 30 minutes prior to your appointment for registration. Make certain not to have anything to eat or drink 6 hours prior to your appointment. Should you need to reschedule your appointment, please contact radiology at [phone]. This test typically takes about 30 minutes to perform.  We have sent the following medications to your pharmacy for you to pick up at your convenience: Pantoprazole (Protonix) 40 mg once daily.  Start a fiber supplement, Benefiber.  Take 1 tablespoon once or twice daily to keep bowels regular if needed.  Start Miralax 1 capful daily in 8 ounces of liquid.  Follow up in 1 month.  You have been scheduled for an endoscopy. Please follow written instructions given to you at your visit today.  If you use inhalers (even only as needed), please bring them with you on the day of your procedure.  If you take any of the following medications, they will need to be adjusted prior to your procedure:   DO NOT TAKE 7 DAYS PRIOR TO TEST- Trulicity (dulaglutide) Ozempic, Wegovy  (semaglutide) Mounjaro (tirzepatide) Bydureon Bcise (exanatide extended release)  DO NOT TAKE 1 DAY PRIOR TO YOUR TEST Rybelsus (semaglutide) Adlyxin (lixisenatide) Victoza (liraglutide) Byetta (exanatide) ___________________________________________________________________________   Thank you for trusting me with your gastrointestinal care!   Camie Furbish, PA-C  _______________________________________________________  If your blood pressure at your visit was 140/90 or greater, please contact your primary care physician to follow up on this.  _______________________________________________________  If you are age 52 or older, your body mass index should be between 23-30. Your Body mass index is 26.35 kg/m. If this is out of the aforementioned range listed, please consider follow up with your Primary Care Provider.  If you are age 52 or younger, your body mass index should be between 19-25. Your Body mass index is 26.35 kg/m. If this is out of the aformentioned range listed, please consider follow up with your Primary Care Provider.   ________________________________________________________  The [facility] providers would like to encourage you to use MYCHART to communicate with providers for non-urgent requests or questions.  Due to long hold times on the telephone, sending your provider a message by [facility] may be a faster and more efficient way to get a response.  Please allow 48 business hours for a response.  Please remember that this is for non-urgent requests.  _______________________________________________________ med

## 2023-10-25 LAB — IGA: Immunoglobulin A: 204 mg/dL (ref 47–310)

## 2023-10-25 LAB — TISSUE TRANSGLUTAMINASE ABS,IGG,IGA
(tTG) Ab, IgA: <1 U/mL
(tTG) Ab, IgG: <1 U/mL

## 2023-10-27 ENCOUNTER — Ambulatory Visit: Payer: Self-pay | Admitting: Gastroenterology

## 2023-10-31 ENCOUNTER — Ambulatory Visit (HOSPITAL_COMMUNITY)
Admission: RE | Admit: 2023-10-31 | Discharge: 2023-10-31 | Disposition: A | Discharge status: Home / Self Care | Source: Ambulatory Visit | Attending: Gastroenterology | Admitting: Gastroenterology

## 2023-10-31 DIAGNOSIS — R110 Nausea: Secondary | ICD-10-CM | POA: Diagnosis not present

## 2023-10-31 DIAGNOSIS — R140 Abdominal distension (gaseous): Secondary | ICD-10-CM | POA: Diagnosis not present

## 2023-10-31 DIAGNOSIS — R6881 Early satiety: Secondary | ICD-10-CM | POA: Insufficient documentation

## 2023-10-31 DIAGNOSIS — R1310 Dysphagia, unspecified: Secondary | ICD-10-CM | POA: Diagnosis not present

## 2023-10-31 DIAGNOSIS — R1013 Epigastric pain: Secondary | ICD-10-CM | POA: Diagnosis not present

## 2023-10-31 DIAGNOSIS — R09A2 Foreign body sensation, throat: Secondary | ICD-10-CM | POA: Diagnosis not present

## 2023-10-31 DIAGNOSIS — K5900 Constipation, unspecified: Secondary | ICD-10-CM | POA: Diagnosis not present

## 2023-11-04 MED ORDER — PANTOPRAZOLE SODIUM 40 MG PO TBEC: 40.0000 mg | DELAYED_RELEASE_TABLET | Freq: Every day | ORAL | 3 refills | Status: AC

## 2023-11-05 ENCOUNTER — Encounter: Payer: Self-pay | Admitting: Gastroenterology

## 2023-11-05 ENCOUNTER — Ambulatory Visit: Admitting: Gastroenterology

## 2023-11-05 VITALS — BP 111/74 | HR 78 | Temp 97.7°F | Resp 19 | Ht 65.0 in | Wt 158.0 lb

## 2023-11-05 DIAGNOSIS — R140 Abdominal distension (gaseous): Secondary | ICD-10-CM

## 2023-11-05 DIAGNOSIS — R6881 Early satiety: Secondary | ICD-10-CM

## 2023-11-05 DIAGNOSIS — R1013 Epigastric pain: Secondary | ICD-10-CM

## 2023-11-05 DIAGNOSIS — R1310 Dysphagia, unspecified: Secondary | ICD-10-CM

## 2023-11-05 DIAGNOSIS — K319 Disease of stomach and duodenum, unspecified: Secondary | ICD-10-CM | POA: Diagnosis not present

## 2023-11-05 DIAGNOSIS — K449 Diaphragmatic hernia without obstruction or gangrene: Secondary | ICD-10-CM

## 2023-11-05 DIAGNOSIS — R110 Nausea: Secondary | ICD-10-CM

## 2023-11-05 DIAGNOSIS — R09A2 Foreign body sensation, throat: Secondary | ICD-10-CM

## 2023-11-05 MED ORDER — LINACLOTIDE 145 MCG PO CAPS: 145.0000 ug | ORAL_CAPSULE | Freq: Every day | ORAL | 3 refills | Status: DC

## 2023-11-05 MED ORDER — SODIUM CHLORIDE 0.9 % IV SOLN: 500.0000 mL | Freq: Once | INTRAVENOUS | Status: DC

## 2023-11-05 NOTE — Patient Instructions (Signed)

## 2023-11-05 NOTE — Op Note (Signed)
 [facility] Patient Name: [name] Procedure Date: 11/05/2023 2:05 PM MRN: [id number] Endoscopist: Gustav ALONSO Mcgee , MD, [id number] Age: 54 Referring MD:  Date of Birth: [birth year] Gender: Female Account #: [id number] Procedure:                Upper GI endoscopy Indications:              Epigastric abdominal pain Medicines:                Monitored Anesthesia Care Procedure:                Pre-Anesthesia Assessment:                           - Prior to the procedure, a History and Physical                            was performed, and patient medications and                            allergies were reviewed. The patient's tolerance of                            previous anesthesia was also reviewed. The risks                            and benefits of the procedure and the sedation                            options and risks were discussed with the patient.                            All questions were answered, and informed consent                            was obtained. Prior Anticoagulants: The patient has                            taken no anticoagulant or antiplatelet agents. ASA                            Grade Assessment: II - A patient with mild systemic                            disease. After reviewing the risks and benefits,                            the patient was deemed in satisfactory condition to                            undergo the procedure.                           After obtaining informed consent, the endoscope was  passed under direct vision. Throughout the                            procedure, the patient's blood pressure, pulse, and                            oxygen saturations were monitored continuously. The                            GIF [id number] #[id number] was introduced through the                            mouth, and advanced to the second part of duodenum.                            The upper GI endoscopy  was accomplished without                            difficulty. The patient tolerated the procedure                            well. Scope In: Scope Out: Findings:                 The Z-line was regular and was found 38 cm from the                            incisors.                           No gross lesions were noted in the entire esophagus.                           A 3 cm hiatal hernia was present.                           A single 10 mm mucosal papule (nodule) with no                            bleeding and no stigmata of recent bleeding was                            found in the gastric antrum. The nodule was Paris                            classification Is (protruding, sessile). Biopsies                            were taken with a cold forceps for histology.                           The cardia and gastric fundus were normal on  retroflexion.                           The examined duodenum was normal. Complications:            No immediate complications. Estimated Blood Loss:     Estimated blood loss was minimal. Impression:               - Z-line regular, 38 cm from the incisors.                           - No gross lesions in the entire esophagus.                           - 3 cm hiatal hernia.                           - A single mucosal papule (nodule) found in the                            stomach. Biopsied.                           - Normal examined duodenum. Recommendation:           - Resume previous diet.                           - Continue present medications.                           - Await pathology results.                           - Linzess  145mcg daily Rx 30 days with 3 refills                           - F/u in GI office as scheduled Alyssa Virella V. Alice Burnside, MD 11/05/2023 2:46:04 PM This report has been signed electronically.

## 2023-11-05 NOTE — Progress Notes (Signed)
 [facility] History and Physical   Primary Care Physician:  Tower, Laine LABOR, MD   Reason for Procedure:  Epigastric abdominal pain  Plan:    EGD with possible interventions as needed     HPI: [name] is a very pleasant 53 y.o. female here for nausea, post prandial discomfort and abdominal distension.   The risks and benefits as well as alternatives of endoscopic procedure(s) have been discussed and reviewed. All questions answered. The patient agrees to proceed.    Past Medical History:  Diagnosis Date   Hip dysplasia    Hx of colonic polyps    Kidney stones    Migraine    chronic, daily   Temporomandibular joint disorders, unspecified     Past Surgical History:  Procedure Laterality Date   ABDOMINAL HYSTERECTOMY     KNEE CARTILAGE SURGERY  1988   torn cartilage   NECK SURGERY     PARTIAL HYSTERECTOMY  1/07   Endometriosis; ovarian cyst (1/2 ovary on left)   TUBAL LIGATION      Prior to Admission medications   Medication Sig Start Date End Date Taking? Authorizing Provider  Multiple Vitamins-Minerals (MULTIVITAMIN WITH MINERALS) tablet Take 1 tablet by mouth daily.   Yes [provider]  pantoprazole  (PROTONIX ) 40 MG tablet Take 1 tablet (40 mg total) by mouth daily. 11/04/23  Yes Heinz, Sara E, PA-C  senna (SENOKOT) 8.6 MG tablet Take 1 tablet by mouth daily.    [provider]    Current Outpatient Medications  Medication Sig Dispense Refill   Multiple Vitamins-Minerals (MULTIVITAMIN WITH MINERALS) tablet Take 1 tablet by mouth daily.     pantoprazole  (PROTONIX ) 40 MG tablet Take 1 tablet (40 mg total) by mouth daily. 90 tablet 3   senna (SENOKOT) 8.6 MG tablet Take 1 tablet by mouth daily.     Current Facility-Administered Medications  Medication Dose Route Frequency Provider Last Rate Last Admin   0.9 %  sodium chloride  infusion  500 mL Intravenous Once Jerrika Ledlow V, MD        Allergies as of 11/05/2023 - Review  Complete 11/05/2023  Allergen Reaction Noted   Buspar  [buspirone ] Nausea Only 12/02/2019   Morphine sulfate Itching 06/14/2006    Family History  Problem Relation Age of Onset   Hypertension Mother    Parkinson's disease Mother    Diabetes Father    Color blindness Father    Hypertension Sister    Esophageal cancer Maternal Grandmother    Diabetes Paternal Grandmother    Ovarian cancer Paternal Grandmother    Cataracts Paternal Grandmother    Pancreatitis Paternal Grandfather    Pancreatic cancer Paternal Grandfather    Cataracts Son        childhood   Diabetes Other        GM   Cataracts Other        Aunt   Melanoma Other        grandparent   Migraines Other        uncle   Colon cancer Neg Hx    Rectal cancer Neg Hx    Stomach cancer Neg Hx    Colon polyps Neg Hx     Social History   Socioeconomic History   Marital status: Married    Spouse name: Not on file   Number of children: 3   Years of education: Not on file   Highest education level: Not on file  Occupational History   Occupation: Museum/gallery curator  Employer: [employer]  Tobacco Use   Smoking status: Never   Smokeless tobacco: Never  Vaping Use   Vaping status: Never Used  Substance and Sexual Activity   Alcohol use: No    Alcohol/week: 0.0 standard drinks of alcohol   Drug use: No   Sexual activity: Not on file  Other Topics Concern   Not on file  Social History Narrative   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  Physical Exam: Vital signs in last 24 hours: BP 120/79   Pulse 82   Temp 97.7 F (36.5 C) (Skin)   Ht 5' 5 (1.651 m)   Wt 158 lb (71.7 kg)   SpO2 100%   BMI 26.29 kg/m  General:   Alert, NAD Lungs:  Clear .   Heart:  Regular rate and rhythm Abdomen:  Soft, nontender and nondistended. Neuro/Psych:  Alert and cooperative. Normal mood and affect. A and O x 3  Reviewed labs, radiology imaging, old records and pertinent past GI work up  Patient is appropriate for planned procedure(s) and anesthesia in an ambulatory setting   K. Veena Laniah Grimm , MD [phone]

## 2023-11-05 NOTE — Progress Notes (Signed)
 Pt's states no medical or surgical changes since previsit or office visit.

## 2023-11-05 NOTE — Progress Notes (Signed)
 Vss nad trans to pacu

## 2023-11-05 NOTE — Progress Notes (Signed)
 Called to room to assist during endoscopic procedure.  Patient ID and intended procedure confirmed with present staff. Received instructions for my participation in the procedure from the performing physician.

## 2023-11-06 ENCOUNTER — Other Ambulatory Visit (HOSPITAL_COMMUNITY): Payer: Self-pay

## 2023-11-06 ENCOUNTER — Telehealth: Payer: Self-pay | Admitting: *Deleted

## 2023-11-06 ENCOUNTER — Telehealth: Payer: Self-pay

## 2023-11-06 NOTE — Telephone Encounter (Signed)
 Noted the pt has been advised

## 2023-11-06 NOTE — Telephone Encounter (Signed)
 Pharmacy Patient Advocate Encounter   Received notification from CoverMyMeds that prior authorization for Linzess  capsules is required/requested.   Insurance verification completed.   The patient is insured through [facility] .   Prior Authorization for Linzess  capsules has been APPROVED from 10-07-2023 to 11-05-2024. Ran test claim, Copay is $35.00. This test claim was processed through [facility]- copay amounts may vary at other pharmacies due to pharmacy/plan contracts, or as the patient moves through the different stages of their insurance plan.   PA #/Case ID/Reference #: [id number]

## 2023-11-06 NOTE — Telephone Encounter (Signed)
  Follow up Call-     11/05/2023    1:32 PM  Call back number  Post procedure Call Back phone  # [phone]  Permission to leave phone message Yes     Patient questions:  Do you have a fever, pain , or abdominal swelling? No. Pain Score  0 *  Have you tolerated food without any problems? Yes.    Have you been able to return to your normal activities? Yes.    Do you have any questions about your discharge instructions: Diet   No. Medications  No. Follow up visit  No.  Do you have questions or concerns about your Care? No.  Actions: * If pain score is 4 or above: No action needed, pain <4.

## 2023-11-10 LAB — SURGICAL PATHOLOGY

## 2023-11-14 DIAGNOSIS — M1612 Unilateral primary osteoarthritis, left hip: Secondary | ICD-10-CM | POA: Diagnosis not present

## 2023-11-18 DIAGNOSIS — M25562 Pain in left knee: Secondary | ICD-10-CM | POA: Diagnosis not present

## 2023-11-18 DIAGNOSIS — W19XXXA Unspecified fall, initial encounter: Secondary | ICD-10-CM | POA: Diagnosis not present

## 2023-11-18 DIAGNOSIS — S8392XA Sprain of unspecified site of left knee, initial encounter: Secondary | ICD-10-CM | POA: Diagnosis not present

## 2023-11-19 DIAGNOSIS — M25462 Effusion, left knee: Secondary | ICD-10-CM | POA: Diagnosis not present

## 2023-11-22 DIAGNOSIS — M25562 Pain in left knee: Secondary | ICD-10-CM | POA: Diagnosis not present

## 2023-12-05 ENCOUNTER — Ambulatory Visit: Admitting: Gastroenterology

## 2023-12-05 ENCOUNTER — Ambulatory Visit: Payer: Self-pay | Admitting: Gastroenterology

## 2023-12-05 ENCOUNTER — Other Ambulatory Visit

## 2023-12-05 ENCOUNTER — Encounter: Payer: Self-pay | Admitting: Gastroenterology

## 2023-12-05 VITALS — BP 118/66 | HR 74 | Ht 65.0 in | Wt 157.0 lb

## 2023-12-05 DIAGNOSIS — R630 Anorexia: Secondary | ICD-10-CM

## 2023-12-05 DIAGNOSIS — R6881 Early satiety: Secondary | ICD-10-CM | POA: Diagnosis not present

## 2023-12-05 DIAGNOSIS — K5900 Constipation, unspecified: Secondary | ICD-10-CM

## 2023-12-05 DIAGNOSIS — R1013 Epigastric pain: Secondary | ICD-10-CM

## 2023-12-05 DIAGNOSIS — R140 Abdominal distension (gaseous): Secondary | ICD-10-CM

## 2023-12-05 DIAGNOSIS — S83512A Sprain of anterior cruciate ligament of left knee, initial encounter: Secondary | ICD-10-CM | POA: Diagnosis not present

## 2023-12-05 LAB — HEPATIC FUNCTION PANEL
ALT: 31 U/L (ref 0–35)
AST: 37 U/L (ref 0–37)
Albumin: 4.5 g/dL (ref 3.5–5.2)
Alkaline Phosphatase: 58 U/L (ref 39–117)
Bilirubin, Direct: 0.1 mg/dL (ref 0.0–0.3)
Total Bilirubin: 0.4 mg/dL (ref 0.2–1.2)
Total Protein: 7.4 g/dL (ref 6.0–8.3)

## 2023-12-05 NOTE — Patient Instructions (Signed)
 You will be contacted by [facility] Scheduling in the next 2 days to arrange a CT Abdomen/Pelvis.  The number on your caller ID will be [phone], please answer when they call.  If you have not heard from them in 2 days please call [phone] to schedule.    You will be contacted by [facility] Scheduling in the next 2 days to arrange a Gastric Emptying Scan.  The number on your caller ID will be [phone], please answer when they call.  If you have not heard from them in 2 days please call [phone] to schedule.     Your provider has requested that you go to the basement level for lab work before leaving today. Press B on the elevator. The lab is located at the first door on the left as you exit the elevator.  Take Linzess  290 mcg samples to see how they work for you for your constipation  STOP Protonix    Due to recent changes in healthcare laws, you may see the results of your imaging and laboratory studies on MyChart before your provider has had a chance to review them.  We understand that in some cases there may be results that are confusing or concerning to you. Not all laboratory results come back in the same time frame and the provider may be waiting for multiple results in order to interpret others.  Please give us  48 hours in order for your provider to thoroughly review all the results before contacting the office for clarification of your results.    _______________________________________________________  If your blood pressure at your visit was 140/90 or greater, please contact your primary care physician to follow up on this.  _______________________________________________________  If you are age 8 or older, your body mass index should be between 23-30. Your Body mass index is 26.13 kg/m. If this is out of the aforementioned range listed, please consider follow up with your Primary Care Provider.  If you are age 85 or younger, your body mass index  should be between 19-25. Your Body mass index is 26.13 kg/m. If this is out of the aformentioned range listed, please consider follow up with your Primary Care Provider.   ________________________________________________________  The [facility] providers would like to encourage you to use MYCHART to communicate with providers for non-urgent requests or questions.  Due to long hold times on the telephone, sending your provider a message by [facility] may be a faster and more efficient way to get a response.  Please allow 48 business hours for a response.  Please remember that this is for non-urgent requests.  _______________________________________________________  [facility] is using a team-based approach to care.  Your team is made up of your doctor and two to three APPS. Our APPS (Nurse Practitioners and Physician Assistants) work with your physician to ensure care continuity for you. They are fully qualified to address your health concerns and develop a treatment plan. They communicate directly with your gastroenterologist to care for you. Seeing the Advanced Practice Practitioners on your physician's team can help you by facilitating care more promptly, often allowing for earlier appointments, access to diagnostic testing, procedures, and other specialty referrals.    I appreciate the  opportunity to care for you  Thank You   Camie Heinz,PA-C

## 2023-12-05 NOTE — Progress Notes (Signed)
 [name] [id number] Nov 20, 1970   Chief Complaint: GERD, nausea/vomiting, constipation  Referring Provider: Randeen Laine LABOR, MD Primary GI MD: Dr. Shila  HPI: [name] is a 53 y.o. female with past medical history of kidney stones, chronic migraine, TMJ disorder, abdominal hysterectomy who presents today for follow up.   Seen in office 05/05/2019 by Dr. Nandigam for complaint of intermittent nausea and vomiting as well as evaluation of abnormal LFTs.  On repeat labs liver enzymes were normal with AST 31, ALT 26, alk phos 50, total bilirubin 0.6, albumin 4.5.   Labs 06/02/2023: Normal CBC, normal CMP, elevated cholesterol, normal TSH, negative HIV, negative hep C  At last visit 10/24/2023 patient endorsed ongoing symptoms of nausea, epigastric pain, bloating, early satiety, globus sensation which were all increasing in frequency.  Was having some occasional pill dysphagia as well.  Prior EGD 2021 with finding of gastritis and negative for H. pylori or intestinal metaplasia.  Was also complaining of longstanding history of constipation which seem to be worsening.  Was taking Senokot daily.  Colonoscopy 2021 with finding of 1 hyperplastic polyp and internal hemorrhoids, otherwise normal.  She was advised to start daily Benefiber and MiraLAX, and if no improvement consideration for Linzess .  Labs showed normal CBC, negative celiac disease, normal TSH, normal lipase, AST mildly elevated at 39 and otherwise normal liver and kidney function.  Right upper quadrant ultrasound was normal.  She underwent EGD 11/05/2023 with finding of a 3 cm hiatal hernia and small nodule in the stomach which on biopsy showed mild reactive gastropathy.  She was started on Linzess  145 mcg daily.   Patient states she is still having problems with digestion.  Has not noticed any improvement on Protonix  and has never really been having acid reflux or heartburn.  States that she has sensation of feeling full  and bloated very quickly with meals, no matter how much she eats.  Has had a decreased appetite.  Has intermittent epigastric pain.  This morning states she still feels full from dinner last night.  Sometimes eats just once a day, but has done this since high school.  She is having a bowel movement every 2 days on Linzess  145 mcg, and is still having some hard stools and straining on this dose.  Denies any blood in her stool or melena.  Denies any diarrhea.  Weight is stable.  She is concerned about her persistent symptoms and would like to pursue further workup.  Previous GI Procedures/Imaging   RUQ US  10/31/2023 No gallstones or ductal dilatation.   EGD 11/05/2023 - Z- line regular, 38 cm from the incisors.  - No gross lesions in the entire esophagus.  - 3 cm hiatal hernia.  - A single mucosal papule (nodule) found in the stomach. Biopsied.  - Normal examined duodenum. Path: 1. Surgical [P], stomach, antrum nodule :       -MILD REACTIVE GASTROPATHY.       -NO INFLAMMATORY PATTERN PREDICTIVE OF HELICOBACTER PYLORI INFECTION IDENTIFIED.       -NEGATIVE FOR INTESTINAL METAPLASIA AND MALIGNANCY.   EGD 05/14/2019 - Normal esophagus.  - Gastritis. Biopsied.  - Normal examined duodenum. Path: 1. Surgical [P], gastric antrum and body - MILD CHRONIC GASTRITIS WITHOUT ACTIVITY - NO H. PYLORI OR INTESTINAL METAPLASIA IDENTIFIED - SEE COMMENT   Colonoscopy 05/14/2019 - One 5 mm polyp in the sigmoid colon, removed with a cold snare. Resected and retrieved.  - Non- bleeding internal hemorrhoids.  - The  examination was otherwise normal. Path: 2. Surgical [P], colon, sigmoid, polyp - HYPERPLASTIC POLYP (1 OF 1 FRAGMENTS) - NO HIGH GRADE DYSPLASIA OR MALIGNANCY IDENTIFIED   CT A/P 04/16/2019 1. No acute findings in the abdomen or pelvis to explain the patient's symptoms. 2. The colon is patulous and stool filled to the sigmoid. No obstipating lesion identified. 3. Status post hysterectomy.    Abdominal ultrasound 04/13/2019 - Normal liver ring gallbladder - 8 mm hyperechoic lesion right kidney possible angiomyolipoma or complex cyst. No lesion seen on the prior CT.   Past Medical History:  Diagnosis Date   Hip dysplasia    Hx of colonic polyps    Kidney stones    Migraine    chronic, daily   Temporomandibular joint disorders, unspecified     Past Surgical History:  Procedure Laterality Date   ABDOMINAL HYSTERECTOMY     KNEE CARTILAGE SURGERY  1988   torn cartilage   NECK SURGERY     PARTIAL HYSTERECTOMY  1/07   Endometriosis; ovarian cyst (1/2 ovary on left)   TUBAL LIGATION      Current Outpatient Medications  Medication Sig Dispense Refill   linaclotide  (LINZESS ) 145 MCG CAPS capsule Take 1 capsule (145 mcg total) by mouth daily before breakfast. 30 capsule 3   Multiple Vitamins-Minerals (MULTIVITAMIN WITH MINERALS) tablet Take 1 tablet by mouth daily.     pantoprazole  (PROTONIX ) 40 MG tablet Take 1 tablet (40 mg total) by mouth daily. 90 tablet 3   senna (SENOKOT) 8.6 MG tablet Take 1 tablet by mouth daily. (Patient not taking: Reported on 12/05/2023)     No current facility-administered medications for this visit.    Allergies as of 12/05/2023 - Review Complete 12/05/2023  Allergen Reaction Noted   Buspar  [buspirone ] Nausea Only 12/02/2019   Morphine sulfate Itching 06/14/2006    Family History  Problem Relation Age of Onset   Hypertension Mother    Parkinson's disease Mother    Diabetes Father    Color blindness Father    Hypertension Sister    Esophageal cancer Maternal Grandmother    Diabetes Paternal Grandmother    Ovarian cancer Paternal Grandmother    Cataracts Paternal Grandmother    Pancreatitis Paternal Grandfather    Pancreatic cancer Paternal Grandfather    Cataracts Son        childhood   Diabetes Other        GM   Cataracts Other        Aunt   Melanoma Other        grandparent   Migraines Other        uncle   Colon cancer  Neg Hx    Rectal cancer Neg Hx    Stomach cancer Neg Hx    Colon polyps Neg Hx     Social History   Tobacco Use   Smoking status: Never   Smokeless tobacco: Never  Vaping Use   Vaping status: Never Used  Substance Use Topics   Alcohol use: No    Alcohol/week: 0.0 standard drinks of alcohol   Drug use: No     Review of Systems:    Constitutional: No weight loss, fever, chills Cardiovascular: No chest pain Respiratory: No SOB  Gastrointestinal: See HPI and otherwise negative Hematologic: No bleeding    Physical Exam:  Vital signs: BP 118/66   Pulse 74   Ht 5' 5 (1.651 m)   Wt 157 lb (71.2 kg)   BMI 26.13 kg/m   Wt  Readings from Last 3 Encounters:  12/05/23 157 lb (71.2 kg)  11/05/23 158 lb (71.7 kg)  10/24/23 158 lb 6 oz (71.8 kg)   Constitutional: Pleasant female in NAD, alert and cooperative Head:  Normocephalic and atraumatic.  Eyes: No scleral icterus.  Respiratory: Respirations even and unlabored. Lungs clear to auscultation bilaterally.  No wheezes, crackles, or rhonchi.  Cardiovascular:  Regular rate and rhythm. No murmurs. No peripheral edema. Gastrointestinal:  Soft, nondistended, tender to palpation of epigastrium. No rebound or guarding. Normal bowel sounds. No appreciable masses or hepatomegaly. Rectal:  Not performed.  Neurologic:  Alert and oriented x4;  grossly normal neurologically.  Skin:   Dry and intact without significant lesions or rashes. Psychiatric: Oriented to person, place and time. Demonstrates good judgement and reason without abnormal affect or behaviors.   RELEVANT LABS AND IMAGING: CBC    Component Value Date/Time   WBC 5.0 10/24/2023 1057   RBC 4.20 10/24/2023 1057   HGB 12.8 10/24/2023 1057   HCT 37.6 10/24/2023 1057   PLT 199.0 10/24/2023 1057   MCV 89.4 10/24/2023 1057   MCH 31.2 10/29/2019 1135   MCHC 34.1 10/24/2023 1057   RDW 13.1 10/24/2023 1057   LYMPHSABS 1.2 10/24/2023 1057   MONOABS 0.4 10/24/2023 1057    EOSABS 0.0 10/24/2023 1057   BASOSABS 0.0 10/24/2023 1057    CMP     Component Value Date/Time   NA 140 10/24/2023 1057   K 3.7 10/24/2023 1057   CL 102 10/24/2023 1057   CO2 31 10/24/2023 1057   GLUCOSE 89 10/24/2023 1057   BUN 10 10/24/2023 1057   CREATININE 0.71 10/24/2023 1057   CALCIUM 9.4 10/24/2023 1057   PROT 7.3 10/24/2023 1057   ALBUMIN 4.6 10/24/2023 1057   AST 39 (H) 10/24/2023 1057   ALT 30 10/24/2023 1057   ALKPHOS 58 10/24/2023 1057   BILITOT 0.5 10/24/2023 1057   GFRNONAA >60 10/29/2019 1135   GFRAA >60 10/29/2019 1135     Assessment/Plan:   Epigastric pain Abdominal bloating Early satiety Decreased appetite Constipation Patient seen today for follow-up of multiple GI complaints.  Continues to have constipation despite Linzess  145 mcg daily.  Having a bowel movement every couple of days with occasional hard stools and straining. Continues to have early satiety, decreased appetite, and epigastric pain.  She does have some tenderness to palpation today.  RUQ US  was normal.  Did have very mild elevation in one of her liver enzymes on most recent labs, otherwise laboratory workup was unremarkable. No improvement in symptoms on Protonix  40 mg daily so we will discontinue this. Will pursue further workup with imaging and repeat hepatic function panel today.  - Order gastric emptying study - Order CT abdomen and pelvis  - Will give samples of Linzess  290 mcg and if improvement constipation can send new prescription for this.  If no improvement consider alternative medication to treat constipation vs diagnostic colonoscopy. - Repeat hepatic function panel - Stop Protonix  - If above workup is unrevealing could consider SIBO breath test versus empiric treatment   Camie Furbish, PA-C [facility] 12/05/2023, 10:03 AM  Patient Care Team: Tower, Laine LABOR, MD as PCP - General (Family Medicine)

## 2023-12-11 ENCOUNTER — Encounter (HOSPITAL_COMMUNITY): Payer: Self-pay

## 2023-12-11 ENCOUNTER — Ambulatory Visit (HOSPITAL_COMMUNITY)
Admission: RE | Admit: 2023-12-11 | Discharge: 2023-12-11 | Disposition: A | Discharge status: Home / Self Care | Source: Ambulatory Visit | Attending: Gastroenterology | Admitting: Gastroenterology

## 2023-12-11 DIAGNOSIS — R140 Abdominal distension (gaseous): Secondary | ICD-10-CM | POA: Diagnosis not present

## 2023-12-11 DIAGNOSIS — R1013 Epigastric pain: Secondary | ICD-10-CM | POA: Diagnosis not present

## 2023-12-11 DIAGNOSIS — D1803 Hemangioma of intra-abdominal structures: Secondary | ICD-10-CM | POA: Diagnosis not present

## 2023-12-11 DIAGNOSIS — Z90710 Acquired absence of both cervix and uterus: Secondary | ICD-10-CM | POA: Diagnosis not present

## 2023-12-11 DIAGNOSIS — R6881 Early satiety: Secondary | ICD-10-CM | POA: Diagnosis not present

## 2023-12-11 DIAGNOSIS — R630 Anorexia: Secondary | ICD-10-CM | POA: Diagnosis not present

## 2023-12-11 MED ORDER — IOHEXOL 300 MG/ML  SOLN
100.0000 mL | Freq: Once | INTRAMUSCULAR | Status: AC | PRN
Start: 2023-12-11 — End: 2023-12-11
  Administered 2023-12-11: 100 mL via INTRAVENOUS

## 2023-12-18 ENCOUNTER — Encounter: Payer: Self-pay | Admitting: Gastroenterology

## 2023-12-23 DIAGNOSIS — S161XXA Strain of muscle, fascia and tendon at neck level, initial encounter: Secondary | ICD-10-CM | POA: Diagnosis not present

## 2023-12-23 DIAGNOSIS — M542 Cervicalgia: Secondary | ICD-10-CM | POA: Diagnosis not present

## 2023-12-23 DIAGNOSIS — S8002XA Contusion of left knee, initial encounter: Secondary | ICD-10-CM | POA: Diagnosis not present

## 2023-12-23 DIAGNOSIS — S39012A Strain of muscle, fascia and tendon of lower back, initial encounter: Secondary | ICD-10-CM | POA: Diagnosis not present

## 2023-12-23 DIAGNOSIS — S40012A Contusion of left shoulder, initial encounter: Secondary | ICD-10-CM | POA: Diagnosis not present

## 2023-12-23 DIAGNOSIS — M25512 Pain in left shoulder: Secondary | ICD-10-CM | POA: Diagnosis not present

## 2023-12-24 ENCOUNTER — Other Ambulatory Visit: Payer: Self-pay

## 2023-12-24 MED ORDER — LINACLOTIDE 290 MCG PO CAPS: 290.0000 ug | ORAL_CAPSULE | Freq: Every day | ORAL | 6 refills | Status: DC

## 2023-12-25 DIAGNOSIS — S83282A Other tear of lateral meniscus, current injury, left knee, initial encounter: Secondary | ICD-10-CM | POA: Diagnosis not present

## 2023-12-25 DIAGNOSIS — G8918 Other acute postprocedural pain: Secondary | ICD-10-CM | POA: Diagnosis not present

## 2023-12-25 DIAGNOSIS — S83512A Sprain of anterior cruciate ligament of left knee, initial encounter: Secondary | ICD-10-CM | POA: Diagnosis not present

## 2023-12-25 DIAGNOSIS — Y999 Unspecified external cause status: Secondary | ICD-10-CM | POA: Diagnosis not present

## 2023-12-25 DIAGNOSIS — X58XXXA Exposure to other specified factors, initial encounter: Secondary | ICD-10-CM | POA: Diagnosis not present

## 2023-12-25 DIAGNOSIS — M2352 Chronic instability of knee, left knee: Secondary | ICD-10-CM | POA: Diagnosis not present

## 2023-12-25 DIAGNOSIS — S83272A Complex tear of lateral meniscus, current injury, left knee, initial encounter: Secondary | ICD-10-CM | POA: Diagnosis not present

## 2023-12-29 ENCOUNTER — Encounter (HOSPITAL_COMMUNITY)
Admission: RE | Admit: 2023-12-29 | Discharge: 2023-12-29 | Disposition: A | Discharge status: Home / Self Care | Source: Ambulatory Visit | Attending: Gastroenterology | Admitting: Gastroenterology

## 2023-12-29 DIAGNOSIS — R6881 Early satiety: Secondary | ICD-10-CM | POA: Insufficient documentation

## 2023-12-29 DIAGNOSIS — R140 Abdominal distension (gaseous): Secondary | ICD-10-CM | POA: Insufficient documentation

## 2023-12-29 DIAGNOSIS — R630 Anorexia: Secondary | ICD-10-CM | POA: Insufficient documentation

## 2023-12-29 DIAGNOSIS — R1013 Epigastric pain: Secondary | ICD-10-CM | POA: Insufficient documentation

## 2023-12-29 MED ORDER — TECHNETIUM TC 99M SULFUR COLLOID
2.2000 | Freq: Once | INTRAVENOUS | Status: AC | PRN
Start: 2023-12-29 — End: 2023-12-29
  Administered 2023-12-29: 2.2 via ORAL

## 2024-01-07 ENCOUNTER — Telehealth: Payer: Self-pay | Admitting: Gastroenterology

## 2024-01-07 DIAGNOSIS — S83282D Other tear of lateral meniscus, current injury, left knee, subsequent encounter: Secondary | ICD-10-CM | POA: Diagnosis not present

## 2024-01-07 NOTE — Telephone Encounter (Signed)
 Patient returning phone call. Please advise, thank you

## 2024-01-07 NOTE — Telephone Encounter (Signed)
 Left message on machine to call back

## 2024-01-07 NOTE — Telephone Encounter (Signed)
 Please let patient know that her gastric emptying study was normal.  Apologize for the delay in getting the results to her.   Documented impression of the study is normal gastric emptying study.  There was some confusion regarding the wording under findings but on review of the actual images she has 93% stomach emptying at 120 minutes which is well within normal range. I did send a message to radiology just for confirmation and waiting on a reply.

## 2024-01-08 NOTE — Telephone Encounter (Signed)
The pt has been advised via My Chart- she does view her messages

## 2024-01-08 NOTE — Telephone Encounter (Signed)
 The pt has been advised of the results. She would like to know if she will be on long term Linzess  for constipation.  She says it does work as long as she takes it.

## 2024-01-08 NOTE — Telephone Encounter (Signed)
 Left message on machine to call back

## 2024-01-09 DIAGNOSIS — M799 Soft tissue disorder, unspecified: Secondary | ICD-10-CM | POA: Diagnosis not present

## 2024-01-09 DIAGNOSIS — M25662 Stiffness of left knee, not elsewhere classified: Secondary | ICD-10-CM | POA: Diagnosis not present

## 2024-01-09 DIAGNOSIS — M25562 Pain in left knee: Secondary | ICD-10-CM | POA: Diagnosis not present

## 2024-01-09 DIAGNOSIS — M6281 Muscle weakness (generalized): Secondary | ICD-10-CM | POA: Diagnosis not present

## 2024-01-14 DIAGNOSIS — M6281 Muscle weakness (generalized): Secondary | ICD-10-CM | POA: Diagnosis not present

## 2024-01-14 DIAGNOSIS — M25662 Stiffness of left knee, not elsewhere classified: Secondary | ICD-10-CM | POA: Diagnosis not present

## 2024-01-14 DIAGNOSIS — M25562 Pain in left knee: Secondary | ICD-10-CM | POA: Diagnosis not present

## 2024-01-14 DIAGNOSIS — M799 Soft tissue disorder, unspecified: Secondary | ICD-10-CM | POA: Diagnosis not present

## 2024-01-15 DIAGNOSIS — M6281 Muscle weakness (generalized): Secondary | ICD-10-CM | POA: Diagnosis not present

## 2024-01-15 DIAGNOSIS — M799 Soft tissue disorder, unspecified: Secondary | ICD-10-CM | POA: Diagnosis not present

## 2024-01-15 DIAGNOSIS — M25662 Stiffness of left knee, not elsewhere classified: Secondary | ICD-10-CM | POA: Diagnosis not present

## 2024-01-15 DIAGNOSIS — M25562 Pain in left knee: Secondary | ICD-10-CM | POA: Diagnosis not present

## 2024-01-26 DIAGNOSIS — M799 Soft tissue disorder, unspecified: Secondary | ICD-10-CM | POA: Diagnosis not present

## 2024-01-26 DIAGNOSIS — M25662 Stiffness of left knee, not elsewhere classified: Secondary | ICD-10-CM | POA: Diagnosis not present

## 2024-01-26 DIAGNOSIS — M6281 Muscle weakness (generalized): Secondary | ICD-10-CM | POA: Diagnosis not present

## 2024-01-26 DIAGNOSIS — M25562 Pain in left knee: Secondary | ICD-10-CM | POA: Diagnosis not present

## 2024-01-29 DIAGNOSIS — M25562 Pain in left knee: Secondary | ICD-10-CM | POA: Diagnosis not present

## 2024-01-29 DIAGNOSIS — M25662 Stiffness of left knee, not elsewhere classified: Secondary | ICD-10-CM | POA: Diagnosis not present

## 2024-01-29 DIAGNOSIS — M6281 Muscle weakness (generalized): Secondary | ICD-10-CM | POA: Diagnosis not present

## 2024-01-29 DIAGNOSIS — M799 Soft tissue disorder, unspecified: Secondary | ICD-10-CM | POA: Diagnosis not present

## 2024-02-02 DIAGNOSIS — M799 Soft tissue disorder, unspecified: Secondary | ICD-10-CM | POA: Diagnosis not present

## 2024-02-02 DIAGNOSIS — M25662 Stiffness of left knee, not elsewhere classified: Secondary | ICD-10-CM | POA: Diagnosis not present

## 2024-02-02 DIAGNOSIS — M6281 Muscle weakness (generalized): Secondary | ICD-10-CM | POA: Diagnosis not present

## 2024-02-02 DIAGNOSIS — M25562 Pain in left knee: Secondary | ICD-10-CM | POA: Diagnosis not present

## 2024-02-05 ENCOUNTER — Ambulatory Visit: Payer: Self-pay | Admitting: Gastroenterology

## 2024-02-05 DIAGNOSIS — M799 Soft tissue disorder, unspecified: Secondary | ICD-10-CM | POA: Diagnosis not present

## 2024-02-05 DIAGNOSIS — M25662 Stiffness of left knee, not elsewhere classified: Secondary | ICD-10-CM | POA: Diagnosis not present

## 2024-02-05 DIAGNOSIS — M6281 Muscle weakness (generalized): Secondary | ICD-10-CM | POA: Diagnosis not present

## 2024-02-05 DIAGNOSIS — M25562 Pain in left knee: Secondary | ICD-10-CM | POA: Diagnosis not present

## 2024-02-26 ENCOUNTER — Encounter: Payer: Self-pay | Admitting: Gastroenterology

## 2024-02-26 ENCOUNTER — Ambulatory Visit: Admitting: Gastroenterology

## 2024-02-26 VITALS — BP 118/74 | HR 71 | Ht 65.0 in | Wt 154.0 lb

## 2024-02-26 DIAGNOSIS — R6881 Early satiety: Secondary | ICD-10-CM

## 2024-02-26 DIAGNOSIS — R140 Abdominal distension (gaseous): Secondary | ICD-10-CM

## 2024-02-26 DIAGNOSIS — R1010 Upper abdominal pain, unspecified: Secondary | ICD-10-CM

## 2024-02-26 DIAGNOSIS — K5900 Constipation, unspecified: Secondary | ICD-10-CM | POA: Diagnosis not present

## 2024-02-26 DIAGNOSIS — R110 Nausea: Secondary | ICD-10-CM

## 2024-02-26 DIAGNOSIS — R630 Anorexia: Secondary | ICD-10-CM

## 2024-02-26 MED ORDER — NA SULFATE-K SULFATE-MG SULF 17.5-3.13-1.6 GM/177ML PO SOLN: 1.0000 | Freq: Once | ORAL | 0 refills | Status: AC

## 2024-02-26 NOTE — Progress Notes (Signed)
 [name] [id number] 14-Jan-1971   Chief Complaint: Abdominal pain  Referring Provider: Randeen Laine LABOR, MD Primary GI MD: Dr. Shila  HPI: [name] is a 53 y.o. female with past medical history of kidney stones, chronic migraine, TMJ disorder, abdominal hysterectomy who presents today for a complaint of pain.    Seen in office 05/05/2019 by Dr. Nandigam for complaint of intermittent nausea and vomiting as well as evaluation of abnormal LFTs.  On repeat labs liver enzymes were normal with AST 31, ALT 26, alk phos 50, total bilirubin 0.6, albumin 4.5.   Labs 06/02/2023: Normal CBC, normal CMP, elevated cholesterol, normal TSH, negative HIV, negative hep C   Seen in office 10/24/2023, patient endorsed ongoing symptoms of nausea, epigastric pain, bloating, early satiety, globus sensation which were all increasing in frequency.  Was having some occasional pill dysphagia as well.  Prior EGD 2021 with finding of gastritis and negative for H. pylori or intestinal metaplasia.   Was also complaining of longstanding history of constipation which seemed to be worsening.  Was taking Senokot daily.  Colonoscopy 2021 with finding of 1 hyperplastic polyp and internal hemorrhoids, otherwise normal.  She was advised to start daily Benefiber and MiraLAX, and if no improvement consideration for Linzess .   Labs showed normal CBC, negative celiac disease, normal TSH, normal lipase, AST mildly elevated at 39 and otherwise normal liver and kidney function.   Right upper quadrant ultrasound was normal.   She underwent EGD 11/05/2023 with finding of a 3 cm hiatal hernia and small nodule in the stomach which on biopsy showed mild reactive gastropathy.   She was started on Linzess  145 mcg daily.  At last visit 12/05/2023 patient continued to have constipation despite Linzess  145 mcg daily.  Continued to have early satiety, decreased appetite, epigastric pain.  Gastric emptying study was ordered as well as  CT abdomen and pelvis.  She was given samples of Linzess  to 90 mcg, with consideration for alternative medication versus diagnostic colonoscopy if no improvement.  No acute findings on CT abdomen/pelvis.  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  Gastric emptying study was normal.  Per patient communication, increased Linzess  has been helpful.   Discussed the use of AI scribe software for clinical note transcription with the patient, who gave verbal consent to proceed.  History of Present Illness [name] is a 53 year old female who presents with worsening constipation, abdominal pain, and bloating.  Constipation and bowel habits - Long-standing constipation with recent worsening - Bowel movements occur every couple of days - Straining required during bowel movements despite high-dose Linzess  - Stools are soft, not diarrhea-like - No hematochezia or melena  Abdominal pain and bloating - Abdominal pain and bloating present - Symptoms are alleviated after bowel movements - Increased abdominal pain if bowel movement is missed  Appetite and gastrointestinal symptoms - Reduced appetite and early satiety - Frequently feels full quickly and eats only small portions - Queasiness after eating, limiting ability to consume full meals - Not a large eater  Prior gastrointestinal evaluation - Colonoscopy in 2021 revealed hyperplastic polyps - Upper endoscopy and CT scan were normal - Gastric emptying study was normal  Recent surgical history - Underwent left knee surgery in September - Out of work for six weeks postoperatively - Has returned to work and is recovering well   Previous GI Procedures/Imaging   CT A/P 12/11/2023 IMPRESSION: 1. No acute abnormality in the abdomen  or pelvis. 2. Trace pelvic free fluid, nonspecific but possibly physiologic. 3. Hypodensity with peripheral focus of enhancement measuring 14 mm in the lateral right lobe of  the liver stable dating back to 2021 and favored a benign hemangioma.  RUQ US  10/31/2023 No gallstones or ductal dilatation.    EGD 11/05/2023 - Z- line regular, 38 cm from the incisors.  - No gross lesions in the entire esophagus.  - 3 cm hiatal hernia.  - A single mucosal papule (nodule) found in the stomach. Biopsied.  - Normal examined duodenum. Path: 1. Surgical [P], stomach, antrum nodule :       -MILD REACTIVE GASTROPATHY.       -NO INFLAMMATORY PATTERN PREDICTIVE OF HELICOBACTER PYLORI INFECTION IDENTIFIED.       -NEGATIVE FOR INTESTINAL METAPLASIA AND MALIGNANCY.    EGD 05/14/2019 - Normal esophagus.  - Gastritis. Biopsied.  - Normal examined duodenum. Path: 1. Surgical [P], gastric antrum and body - MILD CHRONIC GASTRITIS WITHOUT ACTIVITY - NO H. PYLORI OR INTESTINAL METAPLASIA IDENTIFIED - SEE COMMENT   Colonoscopy 05/14/2019 - One 5 mm polyp in the sigmoid colon, removed with a cold snare. Resected and retrieved.  - Non-bleeding internal hemorrhoids.  - The examination was otherwise normal. - Recall 10 years Path: 2. Surgical [P], colon, sigmoid, polyp - HYPERPLASTIC POLYP (1 OF 1 FRAGMENTS) - NO HIGH GRADE DYSPLASIA OR MALIGNANCY IDENTIFIED   CT A/P 04/16/2019 1. No acute findings in the abdomen or pelvis to explain the patient's symptoms. 2. The colon is patulous and stool filled to the sigmoid. No obstipating lesion identified. 3. Status post hysterectomy.   Abdominal ultrasound 04/13/2019 - Normal liver ring gallbladder - 8 mm hyperechoic lesion right kidney possible angiomyolipoma or complex cyst. No lesion seen on the prior CT.   Past Medical History:  Diagnosis Date   Hip dysplasia    Hx of colonic polyps    Kidney stones    Migraine    chronic, daily   Temporomandibular joint disorders, unspecified     Past Surgical History:  Procedure Laterality Date   ABDOMINAL HYSTERECTOMY     KNEE CARTILAGE SURGERY  1988   torn cartilage   NECK SURGERY      PARTIAL HYSTERECTOMY  1/07   Endometriosis; ovarian cyst (1/2 ovary on left)   TUBAL LIGATION      Current Outpatient Medications  Medication Sig Dispense Refill   linaclotide  (LINZESS ) 290 MCG CAPS capsule Take 1 capsule (290 mcg total) by mouth daily before breakfast. 30 capsule 6   meloxicam (MOBIC) 15 MG tablet Take 15 mg by mouth daily.     Multiple Vitamins-Minerals (MULTIVITAMIN WITH MINERALS) tablet Take 1 tablet by mouth daily.     pantoprazole  (PROTONIX ) 40 MG tablet Take 1 tablet (40 mg total) by mouth daily. (Patient not taking: Reported on 02/26/2024) 90 tablet 3   senna (SENOKOT) 8.6 MG tablet Take 1 tablet by mouth daily. (Patient not taking: Reported on 02/26/2024)     No current facility-administered medications for this visit.    Allergies as of 02/26/2024 - Review Complete 02/26/2024  Allergen Reaction Noted   Buspar  [buspirone ] Nausea Only 12/02/2019   Morphine sulfate Itching 06/14/2006    Family History  Problem Relation Age of Onset   Hypertension Mother    Parkinson's disease Mother    Diabetes Father    Color blindness Father    Hypertension Sister    Esophageal cancer Maternal Grandmother    Diabetes Paternal Grandmother  Ovarian cancer Paternal Grandmother    Cataracts Paternal Grandmother    Pancreatitis Paternal Grandfather    Pancreatic cancer Paternal Grandfather    Cataracts Son        childhood   Diabetes Other        GM   Cataracts Other        Aunt   Melanoma Other        grandparent   Migraines Other        uncle   Colon cancer Neg Hx    Rectal cancer Neg Hx    Stomach cancer Neg Hx    Colon polyps Neg Hx     Social History   Tobacco Use   Smoking status: Never   Smokeless tobacco: Never  Vaping Use   Vaping status: Never Used  Substance Use Topics   Alcohol use: No    Alcohol/week: 0.0 standard drinks of alcohol   Drug use: No     Review of Systems:    Constitutional: No significant weight loss, fever,  chills Cardiovascular: No chest pain Respiratory: No SOB  Gastrointestinal: See HPI and otherwise negative   Physical Exam:  Vital signs: BP 118/74 (BP Location: Left Arm, Patient Position: Sitting, Cuff Size: Normal)   Pulse 71   Ht 5' 5 (1.651 m)   Wt 154 lb (69.9 kg)   BMI 25.63 kg/m   Wt Readings from Last 3 Encounters:  02/26/24 154 lb (69.9 kg)  12/05/23 157 lb (71.2 kg)  11/05/23 158 lb (71.7 kg)    Constitutional:, Well-appearing female in NAD, alert and cooperative Head:  Normocephalic and atraumatic.  Eyes: No scleral icterus.  Respiratory: Respirations even and unlabored. Lungs clear to auscultation bilaterally.  No wheezes, crackles, or rhonchi.  Cardiovascular:  Regular rate and rhythm. No murmurs. No peripheral edema. Gastrointestinal:  Soft, nondistended, nontender. No rebound or guarding. Normal bowel sounds. No appreciable masses or hepatomegaly. Rectal:  Not performed.  Neurologic:  Alert and oriented x4;  grossly normal neurologically.  Skin:   Dry and intact without significant lesions or rashes. Psychiatric: Oriented to person, place and time. Demonstrates good judgement and reason without abnormal affect or behaviors.   RELEVANT LABS AND IMAGING: CBC    Component Value Date/Time   WBC 5.0 10/24/2023 1057   RBC 4.20 10/24/2023 1057   HGB 12.8 10/24/2023 1057   HCT 37.6 10/24/2023 1057   PLT 199.0 10/24/2023 1057   MCV 89.4 10/24/2023 1057   MCH 31.2 10/29/2019 1135   MCHC 34.1 10/24/2023 1057   RDW 13.1 10/24/2023 1057   LYMPHSABS 1.2 10/24/2023 1057   MONOABS 0.4 10/24/2023 1057   EOSABS 0.0 10/24/2023 1057   BASOSABS 0.0 10/24/2023 1057    CMP     Component Value Date/Time   NA 140 10/24/2023 1057   K 3.7 10/24/2023 1057   CL 102 10/24/2023 1057   CO2 31 10/24/2023 1057   GLUCOSE 89 10/24/2023 1057   BUN 10 10/24/2023 1057   CREATININE 0.71 10/24/2023 1057   CALCIUM 9.4 10/24/2023 1057   PROT 7.4 12/05/2023 1025   ALBUMIN 4.5  12/05/2023 1025   AST 37 12/05/2023 1025   ALT 31 12/05/2023 1025   ALKPHOS 58 12/05/2023 1025   BILITOT 0.4 12/05/2023 1025   GFRNONAA >60 10/29/2019 1135   GFRAA >60 10/29/2019 1135     Assessment/Plan:   Assessment & Plan Chronic constipation with abdominal pain and bloating Chronic constipation with worsening abdominal pain and bloating despite high-dose  Linzess . Symptoms include abdominal pain, and bloating, with improvement in bowel movement frequency but not complete relief. Differential includes slow transit constipation. Previous colonoscopy in 2021 showed benign hyperplastic polyps. CT scan was unremarkable. Gastric emptying study was normal. As she has had recent worsening of constipation and abdominal pain, recommend colonoscopy to rule out any underlying malignancy, stricture, etc.  - Scheduled colonoscopy with extended bowel prep due to ongoing constipation. I thoroughly discussed the procedure with the patient to include nature of the procedure, alternatives, benefits, and risks (including but not limited to bleeding, infection, perforation, anesthesia/cardiac/pulmonary complications). Patient verbalized understanding and gave verbal consent to proceed with procedure.  - Continue Linzess  290 mcg daily for now. - Will consider alternative constipation medications if colonoscopy is normal.  Nausea Intermittent nausea, particularly after eating, leading to reduced food intake. Nausea may be related to constipation and abdominal discomfort.  - Continue to monitor symptoms and adjust treatment plan based on colonoscopy findings.   Camie Furbish, PA-C [facility] 02/26/2024, 11:22 AM  Patient Care Team: Tower, Laine LABOR, MD as PCP - General (Family Medicine)

## 2024-02-26 NOTE — Patient Instructions (Signed)
 You have been scheduled for a colonoscopy. Please follow written instructions given to you at your visit today.   If you use inhalers (even only as needed), please bring them with you on the day of your procedure.  DO NOT TAKE 7 DAYS PRIOR TO TEST- Trulicity (dulaglutide) Ozempic, Wegovy (semaglutide) Mounjaro, Zepbound (tirzepatide) Bydureon Bcise (exanatide extended release)  DO NOT TAKE 1 DAY PRIOR TO YOUR TEST Rybelsus (semaglutide) Adlyxin (lixisenatide) Victoza (liraglutide) Byetta (exanatide) ___________________________________________________________________________   If your blood pressure at your visit was 140/90 or greater, please contact your primary care physician to follow up on this.  _______________________________________________________  If you are age 25 or older, your body mass index should be between 23-30. Your Body mass index is 25.63 kg/m. If this is out of the aforementioned range listed, please consider follow up with your Primary Care Provider.  If you are age 84 or younger, your body mass index should be between 19-25. Your Body mass index is 25.63 kg/m. If this is out of the aformentioned range listed, please consider follow up with your Primary Care Provider.   ________________________________________________________  The [facility] providers would like to encourage you to use MYCHART to communicate with providers for non-urgent requests or questions.  Due to long hold times on the telephone, sending your provider a message by [facility] may be a faster and more efficient way to get a response.  Please allow 48 business hours for a response.  Please remember that this is for non-urgent requests.  _______________________________________________________  [facility] is using a team-based approach to care.  Your team is made up of your doctor and two to three APPS. Our APPS (Nurse Practitioners and Physician Assistants) work with your physician to  ensure care continuity for you. They are fully qualified to address your health concerns and develop a treatment plan. They communicate directly with your gastroenterologist to care for you. Seeing the Advanced Practice Practitioners on your physician's team can help you by facilitating care more promptly, often allowing for earlier appointments, access to diagnostic testing, procedures, and other specialty referrals.

## 2024-02-27 ENCOUNTER — Encounter: Payer: Self-pay | Admitting: Gastroenterology

## 2024-04-09 ENCOUNTER — Encounter: Payer: Self-pay | Admitting: Gastroenterology

## 2024-04-15 ENCOUNTER — Ambulatory Visit (AMBULATORY_SURGERY_CENTER): Admitting: Gastroenterology

## 2024-04-15 ENCOUNTER — Encounter: Payer: Self-pay | Admitting: Gastroenterology

## 2024-04-15 VITALS — BP 134/93 | HR 75 | Temp 97.3°F | Resp 14 | Ht 65.0 in | Wt 154.0 lb

## 2024-04-15 DIAGNOSIS — K644 Residual hemorrhoidal skin tags: Secondary | ICD-10-CM | POA: Diagnosis not present

## 2024-04-15 DIAGNOSIS — R140 Abdominal distension (gaseous): Secondary | ICD-10-CM

## 2024-04-15 DIAGNOSIS — K648 Other hemorrhoids: Secondary | ICD-10-CM

## 2024-04-15 DIAGNOSIS — K5900 Constipation, unspecified: Secondary | ICD-10-CM | POA: Diagnosis present

## 2024-04-15 DIAGNOSIS — R194 Change in bowel habit: Secondary | ICD-10-CM

## 2024-04-15 DIAGNOSIS — K5904 Chronic idiopathic constipation: Secondary | ICD-10-CM

## 2024-04-15 MED ORDER — SODIUM CHLORIDE 0.9 % IV SOLN: 500.0000 mL | Freq: Once | INTRAVENOUS | Status: AC

## 2024-04-15 MED ORDER — LINACLOTIDE 290 MCG PO CAPS: 290.0000 ug | ORAL_CAPSULE | Freq: Every day | ORAL | 3 refills | Status: AC

## 2024-04-15 MED ORDER — HYDROCORTISONE 1 % EX OINT: 1.0000 | TOPICAL_OINTMENT | Freq: Two times a day (BID) | CUTANEOUS | 1 refills | Status: AC | PRN

## 2024-04-15 NOTE — Progress Notes (Signed)
 A/o x 3, VSS, good SR's, pleased with anesthesia, report to RN

## 2024-04-15 NOTE — Op Note (Signed)
 [facility] Patient Name: [name] Procedure Date: 04/15/2024 8:47 AM MRN: [id number] Endoscopist: Gustav ALONSO Mcgee , MD, [id number] Age: 54 Referring MD:  Date of Birth: April 27, 1970 Gender: Female Account #: [id number] Procedure:                Colonoscopy Indications:              Generalized abdominal pain, Change in bowel habits,                            Constipation Medicines:                Monitored Anesthesia Care Procedure:                Pre-Anesthesia Assessment:                           - Prior to the procedure, a History and Physical                            was performed, and patient medications and                            allergies were reviewed. The patient's tolerance of                            previous anesthesia was also reviewed. The risks                            and benefits of the procedure and the sedation                            options and risks were discussed with the patient.                            All questions were answered, and informed consent                            was obtained. Prior Anticoagulants: The patient has                            taken no anticoagulant or antiplatelet agents. ASA                            Grade Assessment: I - A normal, healthy patient.                            After reviewing the risks and benefits, the patient                            was deemed in satisfactory condition to undergo the                            procedure.  After obtaining informed consent, the colonoscope                            was passed under direct vision. Throughout the                            procedure, the patient's blood pressure, pulse, and                            oxygen saturations were monitored continuously. The                            Olympus Scope [id number] was introduced through the                            anus and advanced to the the cecum, identified by                             appendiceal orifice and ileocecal valve. The                            colonoscopy was performed without difficulty. The                            patient tolerated the procedure well. The quality                            of the bowel preparation was good. The ileocecal                            valve, appendiceal orifice, and rectum were                            photographed. Scope In: 8:57:25 AM Scope Out: 9:11:26 AM Scope Withdrawal Time: 0 hours 9 minutes 21 seconds  Total Procedure Duration: 0 hours 14 minutes 1 second  Findings:                 The perianal and digital rectal examinations were                            normal.                           Non-bleeding external and internal hemorrhoids were                            found during retroflexion. The hemorrhoids were                            medium-sized.                           The exam was otherwise without abnormality. Complications:            No immediate complications. Estimated Blood Loss:  Estimated blood loss: none. Impression:               - Non-bleeding external and internal hemorrhoids.                           - The examination was otherwise normal.                           - No specimens collected. Recommendation:           - Resume previous diet.                           - Continue present medications.                           - Repeat colonoscopy in 10 years for surveillance.                           - Refer to pelvic floor physical therapy for                            dysysnergic defcation, pelvic floor dysfunctio at                            appointment to be scheduled. Alyssa Macphee V. Alyssa Bennett, MD 04/15/2024 9:18:58 AM This report has been signed electronically.

## 2024-04-15 NOTE — Progress Notes (Signed)
 [facility] History and Physical   Primary Care Physician:  Tower, Laine LABOR, MD   Reason for Procedure:  Worsening constipation,  change in bowel habits,abdominal pain,  bloating  Plan:    colonoscopy with possible interventions as needed     HPI: [name] is a very pleasant 54 y.o. female here for colonoscopy for change in bowel habits, worsening constipation, abdominal pain,  bloating  Please refer to office visit note by Camie Furbish  The risks and benefits as well as alternatives of endoscopic procedure(s) have been discussed and reviewed.  The patient was provided an opportunity to ask questions and all were answered. The patient agreed with the plan and demonstrated an understanding of the instructions.   Past Medical History:  Diagnosis Date   Hip dysplasia    Hx of colonic polyps    Kidney stones    Migraine    chronic, daily   Temporomandibular joint disorders, unspecified     Past Surgical History:  Procedure Laterality Date   ABDOMINAL HYSTERECTOMY     KNEE CARTILAGE SURGERY  1988   torn cartilage   NECK SURGERY     PARTIAL HYSTERECTOMY  1/07   Endometriosis; ovarian cyst (1/2 ovary on left)   TUBAL LIGATION      Prior to Admission medications  Medication Sig Start Date End Date Taking? Authorizing Provider  linaclotide  (LINZESS ) 290 MCG CAPS capsule Take 1 capsule (290 mcg total) by mouth daily before breakfast. 12/24/23   Furbish, Sara E, PA-C  meloxicam (MOBIC) 15 MG tablet Take 15 mg by mouth daily. Patient not taking: Reported on 04/15/2024    [provider]  Multiple Vitamins-Minerals (MULTIVITAMIN WITH MINERALS) tablet Take 1 tablet by mouth daily.    [provider]  pantoprazole  (PROTONIX ) 40 MG tablet Take 1 tablet (40 mg total) by mouth daily. Patient not taking: No sig reported 11/04/23   Heinz, Sara E, PA-C  senna (SENOKOT) 8.6 MG tablet Take 1 tablet by mouth daily. Patient not taking: No sig reported    [provider]    Current Outpatient Medications  Medication Sig Dispense Refill   linaclotide  (LINZESS ) 290 MCG CAPS capsule Take 1 capsule (290 mcg total) by mouth daily before breakfast. 30 capsule 6   meloxicam (MOBIC) 15 MG tablet Take 15 mg by mouth daily. (Patient not taking: Reported on 04/15/2024)     Multiple Vitamins-Minerals (MULTIVITAMIN WITH MINERALS) tablet Take 1 tablet by mouth daily.     pantoprazole  (PROTONIX ) 40 MG tablet Take 1 tablet (40 mg total) by mouth daily. (Patient not taking: No sig reported) 90 tablet 3   senna (SENOKOT) 8.6 MG tablet Take 1 tablet by mouth daily. (Patient not taking: No sig reported)     Current Facility-Administered Medications  Medication Dose Route Frequency Provider Last Rate Last Admin   0.9 %  sodium chloride  infusion  500 mL Intravenous Once Beverely Suen V, MD        Allergies as of 04/15/2024 - Review Complete 04/15/2024  Allergen Reaction Noted   Buspar  [buspirone ] Nausea Only 12/02/2019   Morphine sulfate Itching 06/14/2006    Family History  Problem Relation Age of Onset   Hypertension Mother    Parkinson's disease Mother    Diabetes Father    Color blindness Father    Hypertension Sister    Esophageal cancer Maternal Grandmother    Diabetes Paternal Grandmother    Ovarian cancer Paternal Grandmother    Cataracts Paternal Grandmother  Pancreatitis Paternal Grandfather    Pancreatic cancer Paternal Grandfather    Cataracts Son        childhood   Diabetes Other        GM   Cataracts Other        Aunt   Melanoma Other        grandparent   Migraines Other        uncle   Colon cancer Neg Hx    Rectal cancer Neg Hx    Stomach cancer Neg Hx    Colon polyps Neg Hx     Social History   Socioeconomic History   Marital status: Married    Spouse name: Not on file   Number of children: 3   Years of education: Not on file   Highest education level: Not on file  Occupational History   Occupation: Neurosurgeon: [employer]  Tobacco Use   Smoking status: Never   Smokeless tobacco: Never  Vaping Use   Vaping status: Never Used  Substance and Sexual Activity   Alcohol use: No    Alcohol/week: 0.0 standard drinks of alcohol   Drug use: No   Sexual activity: Not on file  Other Topics Concern   Not on file  Social History Narrative   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; 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      Married; husband is a deacon x 9 years      Children:3--2 from previous marriage (previous spouse ETOH)      [employer] office   Social Drivers of Health   Tobacco Use: Low Risk (04/15/2024)   Patient History    Smoking Tobacco Use: Never    Smokeless Tobacco Use: Never    Passive Exposure: Not on file  Financial Resource Strain: Not on file  Food Insecurity: Not on file  Transportation Needs: Not on file  Physical Activity: Not on file  Stress: Not on file  Social Connections: Not on file  Intimate Partner Violence: Not on file  Depression (EYV7-0): Medium Risk (06/02/2023)   Depression (PHQ2-9)    PHQ-2 Score: 8  Alcohol Screen: Not on file  Housing: Not on file  Utilities: Not on file  Health Literacy: Not on file    Review of Systems:  All other review of systems negative except as mentioned in the HPI.  Physical Exam: Vital signs in last 24 hours: BP 125/89   Pulse 94   Temp (!) 97.3 F (36.3 C)   Ht 5' 5 (1.651 m)   Wt 154 lb (69.9 kg)   SpO2 100%   BMI 25.63 kg/m  General:   Alert, NAD Lungs:  Clear .   Heart:  Regular rate and rhythm Abdomen:  Soft, nontender and nondistended. Neuro/Psych:  Alert and cooperative. Normal mood and affect. A and O x 3  Reviewed labs, radiology imaging, old records and pertinent past GI work up  Patient is appropriate for planned procedure(s) and anesthesia in an ambulatory setting   K. Veena Angelisse Riso , MD [phone]

## 2024-04-15 NOTE — Progress Notes (Signed)
 Pt's states no medical or surgical changes since previsit or office visit.

## 2024-04-15 NOTE — Patient Instructions (Addendum)
 Resume previous diet.  Repeat colonoscopy in 10 years!    YOU HAD AN ENDOSCOPIC PROCEDURE TODAY AT THE [facility]:   Refer to the procedure report that was given to you for any specific questions about what was found during the examination.  If the procedure report does not answer your questions, please call your gastroenterologist to clarify.  If you requested that your care partner not be given the details of your procedure findings, then the procedure report has been included in a sealed envelope for you to review at your convenience later.  YOU SHOULD EXPECT: Some feelings of bloating in the abdomen. Passage of more gas than usual.  Walking can help get rid of the air that was put into your GI tract during the procedure and reduce the bloating. If you had a lower endoscopy (such as a colonoscopy or flexible sigmoidoscopy) you may notice spotting of blood in your stool or on the toilet paper. If you underwent a bowel prep for your procedure, you may not have a normal bowel movement for a few days.  Please Note:  You might notice some irritation and congestion in your nose or some drainage.  This is from the oxygen used during your procedure.  There is no need for concern and it should clear up in a day or so.  SYMPTOMS TO REPORT IMMEDIATELY:  Following lower endoscopy (colonoscopy or flexible sigmoidoscopy):  Excessive amounts of blood in the stool  Significant tenderness or worsening of abdominal pains  Swelling of the abdomen that is new, acute  Fever of 100F or higher  For urgent or emergent issues, a gastroenterologist can be reached at any hour by calling [phone]. Do not use MyChart messaging for urgent concerns.    DIET:  We do recommend a small meal at first, but then you may proceed to your regular diet.  Drink plenty of fluids but you should avoid alcoholic beverages for 24 hours.  ACTIVITY:  You should plan to take it easy for the rest of today and you should  NOT DRIVE or use heavy machinery until tomorrow (because of the sedation medicines used during the test).    FOLLOW UP: Our staff will call the number listed on your records the next business day following your procedure.  We will call around 7:15- 8:00 am to check on you and address any questions or concerns that you may have regarding the information given to you following your procedure. If we do not reach you, we will leave a message.     If any biopsies were taken you will be contacted by phone or by letter within the next 1-3 weeks.  Please call us  at [phone] if you have not heard about the biopsies in 3 weeks.    SIGNATURES/CONFIDENTIALITY: You and/or your care partner have signed paperwork which will be entered into your electronic medical record.  These signatures attest to the fact that that the information above on your After Visit Summary has been reviewed and is understood.  Full responsibility of the confidentiality of this discharge information lies with you and/or your care-partner.

## 2024-04-16 ENCOUNTER — Telehealth: Payer: Self-pay

## 2024-04-16 ENCOUNTER — Other Ambulatory Visit: Payer: Self-pay | Admitting: *Deleted

## 2024-04-16 DIAGNOSIS — K5902 Outlet dysfunction constipation: Secondary | ICD-10-CM

## 2024-04-16 DIAGNOSIS — M6289 Other specified disorders of muscle: Secondary | ICD-10-CM

## 2024-04-16 NOTE — Telephone Encounter (Signed)
" °  Follow up Call-     04/15/2024    7:49 AM 11/05/2023    1:32 PM  Call back number  Post procedure Call Back phone  # [phone]  Permission to leave phone message Yes Yes     Patient questions:  Do you have a fever, pain , or abdominal swelling? No. Pain Score  0 *  Have you tolerated food without any problems? Yes.    Have you been able to return to your normal activities? Yes.    Do you have any questions about your discharge instructions: Diet   No. Medications  No. Follow up visit  No.  Do you have questions or concerns about your Care? No.  Actions: * If pain score is 4 or above: No action needed, pain <4.   "
